# Patient Record
Sex: Male | Born: 1970 | Race: White | Hispanic: No | Marital: Married | Smoking: Current every day smoker
Health system: Southern US, Community
[De-identification: ages and names within clinical notes are randomized; demographics above are authoritative.]

## PROBLEM LIST (undated history)

## (undated) DIAGNOSIS — K759 Inflammatory liver disease, unspecified: Secondary | ICD-10-CM

## (undated) DIAGNOSIS — B1920 Unspecified viral hepatitis C without hepatic coma: Secondary | ICD-10-CM

## (undated) HISTORY — PX: CHOLECYSTECTOMY: SHX55

## (undated) HISTORY — PX: KNEE SURGERY: SHX244

## (undated) HISTORY — PX: NOSE SURGERY: SHX723

---

## 1998-03-02 ENCOUNTER — Emergency Department (HOSPITAL_COMMUNITY): Admission: EM | Admit: 1998-03-02 | Discharge: 1998-03-02 | Payer: Self-pay | Admitting: Emergency Medicine

## 1998-10-09 ENCOUNTER — Emergency Department (HOSPITAL_COMMUNITY): Admission: EM | Admit: 1998-10-09 | Discharge: 1998-10-09 | Payer: Self-pay | Admitting: Emergency Medicine

## 1998-10-09 ENCOUNTER — Encounter: Payer: Self-pay | Admitting: Emergency Medicine

## 1999-11-28 ENCOUNTER — Encounter: Payer: Self-pay | Admitting: Emergency Medicine

## 1999-11-28 ENCOUNTER — Emergency Department (HOSPITAL_COMMUNITY): Admission: EM | Admit: 1999-11-28 | Discharge: 1999-11-28 | Payer: Self-pay | Admitting: Emergency Medicine

## 1999-11-30 ENCOUNTER — Emergency Department (HOSPITAL_COMMUNITY): Admission: EM | Admit: 1999-11-30 | Discharge: 1999-11-30 | Payer: Self-pay | Admitting: Emergency Medicine

## 1999-11-30 ENCOUNTER — Encounter: Payer: Self-pay | Admitting: Emergency Medicine

## 2000-03-01 ENCOUNTER — Emergency Department (HOSPITAL_COMMUNITY): Admission: EM | Admit: 2000-03-01 | Discharge: 2000-03-01 | Payer: Self-pay | Admitting: Emergency Medicine

## 2000-03-01 ENCOUNTER — Encounter: Payer: Self-pay | Admitting: Emergency Medicine

## 2000-04-19 ENCOUNTER — Emergency Department (HOSPITAL_COMMUNITY): Admission: EM | Admit: 2000-04-19 | Discharge: 2000-04-19 | Payer: Self-pay | Admitting: *Deleted

## 2000-04-19 ENCOUNTER — Encounter: Payer: Self-pay | Admitting: Emergency Medicine

## 2000-11-24 ENCOUNTER — Emergency Department (HOSPITAL_COMMUNITY): Admission: EM | Admit: 2000-11-24 | Discharge: 2000-11-24 | Payer: Self-pay | Admitting: *Deleted

## 2001-03-28 ENCOUNTER — Emergency Department (HOSPITAL_COMMUNITY): Admission: EM | Admit: 2001-03-28 | Discharge: 2001-03-28 | Payer: Self-pay | Admitting: *Deleted

## 2003-05-02 ENCOUNTER — Ambulatory Visit (HOSPITAL_COMMUNITY): Admission: RE | Admit: 2003-05-02 | Discharge: 2003-05-02 | Payer: Self-pay | Admitting: Family Medicine

## 2003-05-02 ENCOUNTER — Encounter: Payer: Self-pay | Admitting: Family Medicine

## 2003-05-31 ENCOUNTER — Ambulatory Visit (HOSPITAL_COMMUNITY): Admission: RE | Admit: 2003-05-31 | Discharge: 2003-05-31 | Payer: Self-pay | Admitting: *Deleted

## 2003-05-31 ENCOUNTER — Encounter (INDEPENDENT_AMBULATORY_CARE_PROVIDER_SITE_OTHER): Payer: Self-pay | Admitting: Specialist

## 2004-12-01 ENCOUNTER — Ambulatory Visit: Payer: Self-pay | Admitting: Family Medicine

## 2009-11-18 ENCOUNTER — Emergency Department (HOSPITAL_COMMUNITY): Admission: EM | Admit: 2009-11-18 | Discharge: 2009-11-18 | Payer: Self-pay | Admitting: Emergency Medicine

## 2018-05-10 ENCOUNTER — Encounter (HOSPITAL_COMMUNITY): Payer: Self-pay | Admitting: Emergency Medicine

## 2018-05-10 ENCOUNTER — Other Ambulatory Visit: Payer: Self-pay

## 2018-05-10 ENCOUNTER — Emergency Department (HOSPITAL_COMMUNITY): Payer: Self-pay

## 2018-05-10 ENCOUNTER — Emergency Department (HOSPITAL_COMMUNITY)
Admission: EM | Admit: 2018-05-10 | Discharge: 2018-05-10 | Disposition: A | Discharge status: Home / Self Care | Payer: Self-pay | Attending: Emergency Medicine | Admitting: Emergency Medicine

## 2018-05-10 DIAGNOSIS — R222 Localized swelling, mass and lump, trunk: Secondary | ICD-10-CM | POA: Insufficient documentation

## 2018-05-10 DIAGNOSIS — R229 Localized swelling, mass and lump, unspecified: Secondary | ICD-10-CM

## 2018-05-10 DIAGNOSIS — IMO0002 Reserved for concepts with insufficient information to code with codable children: Secondary | ICD-10-CM

## 2018-05-10 HISTORY — DX: Unspecified viral hepatitis C without hepatic coma: B19.20

## 2018-05-10 NOTE — ED Provider Notes (Signed)
[facility] EMERGENCY DEPARTMENT Provider Note   CSN: [id number] Arrival date & time: 05/10/18  1404     History   Chief Complaint Chief Complaint  Patient presents with  . Mass    HPI Justin Lam is a 47 y.o. male.  HPI   47 year old male presents today with rectal mass. Patient notes probably 20 years ago he developed a small nodule on the left buttock. Patient notes that this has slowly progressed in size. He notes that there is no pain associated with this, no bleeding or surrounding redness, he reports normal bowel movements, denies any systemic symptoms. No other similar findings.  Past Medical History:  Diagnosis Date  . Hepatitis C     There are no active problems to display for this patient.   History reviewed. No pertinent surgical history.      Home Medications    Prior to Admission medications   Not on File    Family History No family history on file.  Social History Social History   Tobacco Use  . Smoking status: Not on file  Substance Use Topics  . Alcohol use: Not on file  . Drug use: Not on file     Allergies   Patient has no known allergies.   Review of Systems Review of Systems  All other systems reviewed and are negative.   Physical Exam Updated Vital Signs BP 140/85 (BP Location: Right Arm)   Pulse 94   Temp 98.7 F (37.1 C) (Oral)   Resp 16   SpO2 100%   Physical Exam  Constitutional: He is oriented to person, place, and time. He appears well-developed and well-nourished.  HENT:  Head: Normocephalic and atraumatic.  Eyes: Pupils are equal, round, and reactive to light. Conjunctivae are normal. Right eye exhibits no discharge. Left eye exhibits no discharge. No scleral icterus.  Neck: Normal range of motion. No JVD present. No tracheal deviation present.  Pulmonary/Chest: Effort normal. No stridor.  Neurological: He is alert and oriented to person, place, and time. Coordination normal.    Skin:  Mass is soft and compressible, non tender  Psychiatric: He has a normal mood and affect. His behavior is normal. Judgment and thought content normal.  Nursing note and vitals reviewed.     ED Treatments / Results  Labs (all labs ordered are listed, but only abnormal results are displayed) Labs Reviewed - No data to display  EKG None  Radiology Dg Sacrum/coccyx  Result Date: 05/10/2018 CLINICAL DATA:  Soft tissue mass EXAM: SACRUM AND COCCYX - 2+ VIEW COMPARISON:  None. FINDINGS: Sacrum is intact as is the pelvic ring. No acute fracture is noted. Postsurgical changes are seen in the pelvis. A soft tissue growth is noted consistent with the patient's given clinical history. No calcifications are noted within. No bony abnormality 2 associated with the soft tissue lesion is noted. IMPRESSION: Soft tissue mass lesion consistent with the given clinical history. No bony abnormality is noted. No calcifications are noted within. Electronically Signed   By: Alcide Clever M.D.   On: 05/10/2018 15:09    Procedures Procedures (including critical care time)  Medications Ordered in ED Medications - No data to display   Initial Impression / Assessment and Plan / ED Course  I have reviewed the triage vital signs and the nursing notes.  Pertinent labs & imaging results that were available during my care of the patient were reviewed by me and considered in my medical decision making (  see chart for details).     Labs:   Imaging: DG sacrum coccyx  Consults:  Therapeutics:  Discharge Meds:   Assessment/Plan: 47 year old male presents today with rectal mass. This has been slowly progressing over the last 20 years, no other involvement. There is vascularity noted throughout the mass, versus soft and compressible, nontender. There is no rectal involvement. Patient will need outpatient follow-up with general surgery for consultation on removal at his request. Patient verbalized that he  would follow-up as an outpatient, strict return precautions given. Verbalized understanding and agreement today's plan.   Final Clinical Impressions(s) / ED Diagnoses   Final diagnoses:  Mass    ED Discharge Orders    None       Rosalio LoudHedges, Keshonda Monsour, PA-C 05/10/18 1557    Melene PlanFloyd, Dan, DO 05/10/18 1719

## 2018-05-10 NOTE — ED Provider Notes (Signed)
Patient placed in Quick Look pathway, seen and evaluated   Chief Complaint: mass on bottom  HPI:   Pt has a swollen area on his bottom. Pt has had for over 20 years   ROS: no fever, no chills   Physical Exam:   Gen: No distress  Neuro: Awake and Alert  Skin: Warm    Focused Exam: large swollen area buttock    Initiation of care has begun. The patient has been counseled on the process, plan, and necessity for staying for the completion/evaluation, and the remainder of the medical screening examination   Osie CheeksSofia, Sokhna Christoph K, PA-C 05/10/18 1448    Loren RacerYelverton, David, MD 05/12/18 [phone]

## 2018-05-10 NOTE — ED Notes (Signed)
Patient able to ambulate independently  

## 2018-05-10 NOTE — Discharge Instructions (Addendum)
Please read attached information. If you experience any new or worsening signs or symptoms please return to the emergency room for evaluation. Please follow-up with your primary care provider or specialist as discussed.  °

## 2018-05-10 NOTE — ED Triage Notes (Signed)
Patient complains of large mass, approximately 4 cm across, projecting from left buttock, states he had a growth there since he was 28. Patient alert, oriented, and in no apparent distress at this time.

## 2018-05-19 ENCOUNTER — Emergency Department (HOSPITAL_COMMUNITY)
Admission: EM | Admit: 2018-05-19 | Discharge: 2018-05-19 | Disposition: A | Discharge status: Home / Self Care | Payer: Self-pay | Attending: Emergency Medicine | Admitting: Emergency Medicine

## 2018-05-19 ENCOUNTER — Encounter (HOSPITAL_COMMUNITY): Payer: Self-pay | Admitting: Emergency Medicine

## 2018-05-19 ENCOUNTER — Other Ambulatory Visit: Payer: Self-pay

## 2018-05-19 DIAGNOSIS — Y999 Unspecified external cause status: Secondary | ICD-10-CM | POA: Insufficient documentation

## 2018-05-19 DIAGNOSIS — Y929 Unspecified place or not applicable: Secondary | ICD-10-CM | POA: Insufficient documentation

## 2018-05-19 DIAGNOSIS — Z23 Encounter for immunization: Secondary | ICD-10-CM | POA: Insufficient documentation

## 2018-05-19 DIAGNOSIS — W228XXA Striking against or struck by other objects, initial encounter: Secondary | ICD-10-CM | POA: Insufficient documentation

## 2018-05-19 DIAGNOSIS — S51811A Laceration without foreign body of right forearm, initial encounter: Secondary | ICD-10-CM | POA: Insufficient documentation

## 2018-05-19 DIAGNOSIS — Y939 Activity, unspecified: Secondary | ICD-10-CM | POA: Insufficient documentation

## 2018-05-19 DIAGNOSIS — S41111A Laceration without foreign body of right upper arm, initial encounter: Secondary | ICD-10-CM

## 2018-05-19 MED ORDER — LIDOCAINE-EPINEPHRINE 1 %-1:100000 IJ SOLN
20.0000 mL | Freq: Once | INTRAMUSCULAR | Status: DC
  Filled 2018-05-19: qty 20

## 2018-05-19 MED ORDER — LIDOCAINE HCL (PF) 1 % IJ SOLN
30.0000 mL | Freq: Once | INTRAMUSCULAR | Status: AC
  Administered 2018-05-19: 30 mL

## 2018-05-19 MED ORDER — TETANUS-DIPHTH-ACELL PERTUSSIS 5-2.5-18.5 LF-MCG/0.5 IM SUSP
0.5000 mL | Freq: Once | INTRAMUSCULAR | Status: AC
  Administered 2018-05-19: 0.5 mL via INTRAMUSCULAR
  Filled 2018-05-19: qty 0.5

## 2018-05-19 MED ORDER — LIDOCAINE HCL (PF) 1 % IJ SOLN
INTRAMUSCULAR | Status: AC
  Administered 2018-05-19: 30 mL
  Filled 2018-05-19: qty 30

## 2018-05-19 NOTE — ED Triage Notes (Signed)
Pt has approximately 1 inch laceration on right forearm, bleeding controlled. Pt states cut arm on a piece of steel.

## 2018-05-19 NOTE — Discharge Instructions (Addendum)
Please read attached information. If you experience any new or worsening signs or symptoms please return to the emergency room for evaluation. Please follow-up with your primary care provider or specialist as discussed.  °

## 2018-05-19 NOTE — ED Provider Notes (Signed)
[facility] Provider Note   CSN: [id number] Arrival date & time: 05/19/18  1309    History   Chief Complaint Chief Complaint  Patient presents with  . Laceration    HPI Justin Lam is a 47 y.o. male.  HPI   [age] YOF presents today with complaints of laceration. Patient notes a hitch from a trailer fell striking his right arm causing a wound to the arm. He denies any loss of strength range of motion of the wrist hand or elbow. No significant swelling. Uncertain of his last tetanus vaccination. No other complaints here today.    Past Medical History:  Diagnosis Date  . Hepatitis C     There are no active problems to display for this patient.   No past surgical history on file.      Home Medications    Prior to Admission medications   Not on File    Family History No family history on file.  Social History Social History   Tobacco Use  . Smoking status: Not on file  Substance Use Topics  . Alcohol use: Not on file  . Drug use: Not on file     Allergies   Patient has no known allergies.   Review of Systems Review of Systems  All other systems reviewed and are negative.    Physical Exam Updated Vital Signs BP 120/82 (BP Location: Right Arm)   Pulse 95   Temp 98.2 F (36.8 C) (Oral)   Resp 16   SpO2 99%   Physical Exam  Constitutional: He is oriented to person, place, and time. He appears well-developed and well-nourished.  HENT:  Head: Normocephalic and atraumatic.  Eyes: Pupils are equal, round, and reactive to light. Conjunctivae are normal. Right eye exhibits no discharge. Left eye exhibits no discharge. No scleral icterus.  Neck: Normal range of motion. No JVD present. No tracheal deviation present.  Pulmonary/Chest: Effort normal. No stridor.  Musculoskeletal:  2 cm laceration to the right forearm, no deep system involvement full active range of motion wrist hand and fingers  Neurological:  He is alert and oriented to person, place, and time. Coordination normal.  Psychiatric: He has a normal mood and affect. His behavior is normal. Judgment and thought content normal.  Nursing note and vitals reviewed.    ED Treatments / Results  Labs (all labs ordered are listed, but only abnormal results are displayed) Labs Reviewed - No data to display  EKG None  Radiology No results found.  Procedures .Marland Kitchen.Laceration Repair Date/Time: 05/19/2018 1:40 PM Performed by: Eyvonne MechanicHedges, Frankie Scipio, PA-C Authorized by: Eyvonne MechanicHedges, Anie Juniel, PA-C   Consent:    Consent given by:  Patient   Risks discussed:  Infection, need for additional repair, poor cosmetic result, poor wound healing, nerve damage, pain, retained foreign body, tendon damage and vascular damage   Alternatives discussed:  No treatment and delayed treatment Anesthesia (see MAR for exact dosages):    Anesthesia method:  Local infiltration   Local anesthetic:  Lidocaine 1% w/o epi Laceration details:    Location: Right forearm.   Length (cm):  2 Repair type:    Repair type:  Simple Pre-procedure details:    Preparation:  Patient was prepped and draped in usual sterile fashion Exploration:    Wound exploration: wound explored through full range of motion and entire depth of wound probed and visualized     Wound extent: no areolar tissue violation noted, no fascia violation noted, no  foreign bodies/material noted, no muscle damage noted, no nerve damage noted, no tendon damage noted, no underlying fracture noted and no vascular damage noted     Contaminated: no   Treatment:    Area cleansed with:  Saline   Amount of cleaning:  Standard   Irrigation solution:  Sterile saline   Visualized foreign bodies/material removed: no   Skin repair:    Repair method:  Sutures   Suture size:  3-0   Suture material:  Fast-absorbing gut   Number of sutures:  3 Approximation:    Approximation:  Close Post-procedure details:    Dressing:   Antibiotic ointment   Patient tolerance of procedure:  Tolerated well, no immediate complications   (including critical care time)  Medications Ordered in ED Medications  lidocaine-EPINEPHrine (XYLOCAINE W/EPI) 1 %-1:100000 (with pres) injection 20 mL (has no administration in time range)  Tdap (BOOSTRIX) injection 0.5 mL (has no administration in time range)  lidocaine (PF) (XYLOCAINE) 1 % injection (has no administration in time range)     Initial Impression / Assessment and Plan / ED Course  I have reviewed the triage vital signs and the nursing notes.  Pertinent labs & imaging results that were available during my care of the patient were reviewed by me and considered in my medical decision making (see chart for details).     Simple laceration repaired here without complication. No deep space involvement. No other injuries return partial skin. He verbalized understanding and agreement to today's plan.  Final Clinical Impressions(s) / ED Diagnoses   Final diagnoses:  Laceration of right upper extremity, initial encounter    ED Discharge Orders    None       Rosalio Loud 05/19/18 1341    Samuel Jester, DO 05/22/18 1523

## 2019-07-20 ENCOUNTER — Emergency Department (HOSPITAL_COMMUNITY): Payer: Self-pay | Admitting: Anesthesiology

## 2019-07-20 ENCOUNTER — Ambulatory Visit (HOSPITAL_COMMUNITY)
Admission: EM | Admit: 2019-07-20 | Discharge: 2019-07-21 | Disposition: A | Discharge status: Home / Self Care | Payer: Self-pay | Attending: Orthopedic Surgery | Admitting: Orthopedic Surgery

## 2019-07-20 ENCOUNTER — Emergency Department (HOSPITAL_COMMUNITY): Payer: Self-pay

## 2019-07-20 ENCOUNTER — Other Ambulatory Visit: Payer: Self-pay

## 2019-07-20 ENCOUNTER — Encounter (HOSPITAL_COMMUNITY)
Admission: EM | Disposition: A | Discharge status: Home / Self Care | Payer: Self-pay | Source: Home / Self Care | Attending: Emergency Medicine

## 2019-07-20 ENCOUNTER — Encounter (HOSPITAL_COMMUNITY): Payer: Self-pay

## 2019-07-20 DIAGNOSIS — S81041A Puncture wound with foreign body, right knee, initial encounter: Secondary | ICD-10-CM | POA: Insufficient documentation

## 2019-07-20 DIAGNOSIS — S80252A Superficial foreign body, left knee, initial encounter: Secondary | ICD-10-CM | POA: Diagnosis present

## 2019-07-20 DIAGNOSIS — W294XXA Contact with nail gun, initial encounter: Secondary | ICD-10-CM | POA: Insufficient documentation

## 2019-07-20 DIAGNOSIS — Z419 Encounter for procedure for purposes other than remedying health state, unspecified: Secondary | ICD-10-CM

## 2019-07-20 DIAGNOSIS — B1920 Unspecified viral hepatitis C without hepatic coma: Secondary | ICD-10-CM | POA: Insufficient documentation

## 2019-07-20 DIAGNOSIS — Z20828 Contact with and (suspected) exposure to other viral communicable diseases: Secondary | ICD-10-CM | POA: Insufficient documentation

## 2019-07-20 DIAGNOSIS — Y990 Civilian activity done for income or pay: Secondary | ICD-10-CM | POA: Insufficient documentation

## 2019-07-20 HISTORY — PX: FOREIGN BODY REMOVAL: SHX962

## 2019-07-20 HISTORY — PX: KNEE ARTHROSCOPY: SHX127

## 2019-07-20 LAB — SARS CORONAVIRUS 2 BY RT PCR (HOSPITAL ORDER, PERFORMED IN ~~LOC~~ HOSPITAL LAB): SARS Coronavirus 2: NEGATIVE

## 2019-07-20 SURGERY — ARTHROSCOPY, KNEE
Anesthesia: General | Site: Knee | Laterality: Left

## 2019-07-20 MED ORDER — OXYCODONE HCL 5 MG PO TABS
5.0000 mg | ORAL_TABLET | Freq: Once | ORAL | Status: AC | PRN
  Administered 2019-07-20: 5 mg via ORAL

## 2019-07-20 MED ORDER — ONDANSETRON HCL 4 MG/2ML IJ SOLN: 4.0000 mg | Freq: Once | INTRAMUSCULAR | Status: DC | PRN

## 2019-07-20 MED ORDER — METHOCARBAMOL 1000 MG/10ML IJ SOLN
500.0000 mg | Freq: Three times a day (TID) | INTRAVENOUS | Status: DC
  Filled 2019-07-20 (×4): qty 5

## 2019-07-20 MED ORDER — ROCURONIUM BROMIDE 10 MG/ML (PF) SYRINGE
PREFILLED_SYRINGE | INTRAVENOUS | Status: AC
  Filled 2019-07-20: qty 20

## 2019-07-20 MED ORDER — LACTATED RINGERS IV SOLN
INTRAVENOUS | Status: DC
  Administered 2019-07-20 (×2): via INTRAVENOUS

## 2019-07-20 MED ORDER — FENTANYL CITRATE (PF) 250 MCG/5ML IJ SOLN
INTRAMUSCULAR | Status: AC
  Filled 2019-07-20: qty 5

## 2019-07-20 MED ORDER — METHYLENE BLUE 0.5 % INJ SOLN
2.0000 mL | INTRAVENOUS | Status: AC
  Administered 2019-07-20: 10 mg via INTRAVENOUS
  Filled 2019-07-20: qty 10

## 2019-07-20 MED ORDER — OXYCODONE-ACETAMINOPHEN 5-325 MG PO TABS
1.0000 | ORAL_TABLET | Freq: Four times a day (QID) | ORAL | Status: DC | PRN
  Administered 2019-07-20: 1 via ORAL
  Filled 2019-07-20: qty 1

## 2019-07-20 MED ORDER — DOCUSATE SODIUM 100 MG PO CAPS
100.0000 mg | ORAL_CAPSULE | Freq: Two times a day (BID) | ORAL | Status: DC
  Administered 2019-07-20 – 2019-07-21 (×2): 100 mg via ORAL
  Filled 2019-07-20 (×2): qty 1

## 2019-07-20 MED ORDER — ETOMIDATE 2 MG/ML IV SOLN
0.1000 mg/kg | Freq: Once | INTRAVENOUS | Status: AC
  Administered 2019-07-20: 16 mg via INTRAVENOUS
  Filled 2019-07-20: qty 10

## 2019-07-20 MED ORDER — DEXAMETHASONE SODIUM PHOSPHATE 10 MG/ML IJ SOLN
INTRAMUSCULAR | Status: DC | PRN
  Administered 2019-07-20: 10 mg via INTRAVENOUS

## 2019-07-20 MED ORDER — SODIUM CHLORIDE 0.9 % IR SOLN
Status: DC | PRN
  Administered 2019-07-20: 1000 mL

## 2019-07-20 MED ORDER — ACETAMINOPHEN 160 MG/5ML PO SOLN: 325.0000 mg | ORAL | Status: DC | PRN

## 2019-07-20 MED ORDER — ACETAMINOPHEN 500 MG PO TABS
500.0000 mg | ORAL_TABLET | Freq: Two times a day (BID) | ORAL | Status: DC
  Administered 2019-07-20 – 2019-07-21 (×2): 500 mg via ORAL
  Filled 2019-07-20 (×2): qty 1

## 2019-07-20 MED ORDER — PROPOFOL 10 MG/ML IV BOLUS
INTRAVENOUS | Status: DC | PRN
  Administered 2019-07-20: 150 mg via INTRAVENOUS

## 2019-07-20 MED ORDER — METHOCARBAMOL 500 MG PO TABS
750.0000 mg | ORAL_TABLET | Freq: Three times a day (TID) | ORAL | Status: DC
  Administered 2019-07-20 – 2019-07-21 (×3): 750 mg via ORAL
  Filled 2019-07-20 (×3): qty 2

## 2019-07-20 MED ORDER — SODIUM CHLORIDE 0.9 % IV SOLN
INTRAVENOUS | Status: AC | PRN
  Administered 2019-07-20: 10 mL/h via INTRAVENOUS

## 2019-07-20 MED ORDER — CEFAZOLIN SODIUM-DEXTROSE 1-4 GM/50ML-% IV SOLN
1.0000 g | Freq: Once | INTRAVENOUS | Status: AC
  Administered 2019-07-20: 1 g via INTRAVENOUS
  Filled 2019-07-20: qty 50

## 2019-07-20 MED ORDER — MIDAZOLAM HCL 5 MG/5ML IJ SOLN
INTRAMUSCULAR | Status: DC | PRN
  Administered 2019-07-20: 2 mg via INTRAVENOUS

## 2019-07-20 MED ORDER — KETOROLAC TROMETHAMINE 15 MG/ML IJ SOLN
15.0000 mg | Freq: Four times a day (QID) | INTRAMUSCULAR | Status: DC
  Administered 2019-07-20 – 2019-07-21 (×3): 15 mg via INTRAVENOUS
  Filled 2019-07-20 (×3): qty 1

## 2019-07-20 MED ORDER — ONDANSETRON HCL 4 MG PO TABS: 4.0000 mg | ORAL_TABLET | Freq: Four times a day (QID) | ORAL | Status: DC | PRN

## 2019-07-20 MED ORDER — CEFAZOLIN SODIUM-DEXTROSE 2-4 GM/100ML-% IV SOLN
INTRAVENOUS | Status: AC
  Filled 2019-07-20: qty 100

## 2019-07-20 MED ORDER — ETOMIDATE 2 MG/ML IV SOLN
INTRAVENOUS | Status: AC | PRN
  Administered 2019-07-20: 6 mg via INTRAVENOUS
  Administered 2019-07-20: 4 mg via INTRAVENOUS
  Administered 2019-07-20: 6 mg via INTRAVENOUS

## 2019-07-20 MED ORDER — MIDAZOLAM HCL 2 MG/2ML IJ SOLN
INTRAMUSCULAR | Status: AC
  Filled 2019-07-20: qty 2

## 2019-07-20 MED ORDER — CEFAZOLIN SODIUM-DEXTROSE 1-4 GM/50ML-% IV SOLN
1.0000 g | Freq: Four times a day (QID) | INTRAVENOUS | Status: AC
  Administered 2019-07-20 – 2019-07-21 (×3): 1 g via INTRAVENOUS
  Filled 2019-07-20 (×3): qty 50

## 2019-07-20 MED ORDER — CEFAZOLIN SODIUM-DEXTROSE 2-3 GM-%(50ML) IV SOLR
INTRAVENOUS | Status: DC | PRN
  Administered 2019-07-20: 2 g via INTRAVENOUS

## 2019-07-20 MED ORDER — METOCLOPRAMIDE HCL 5 MG/ML IJ SOLN: 5.0000 mg | Freq: Three times a day (TID) | INTRAMUSCULAR | Status: DC | PRN

## 2019-07-20 MED ORDER — FENTANYL CITRATE (PF) 100 MCG/2ML IJ SOLN
25.0000 ug | INTRAMUSCULAR | Status: DC | PRN
  Administered 2019-07-20 (×3): 50 ug via INTRAVENOUS

## 2019-07-20 MED ORDER — CEFAZOLIN SODIUM-DEXTROSE 2-4 GM/100ML-% IV SOLN: 2.0000 g | INTRAVENOUS | Status: DC

## 2019-07-20 MED ORDER — HYDROMORPHONE HCL 1 MG/ML IJ SOLN
1.0000 mg | Freq: Once | INTRAMUSCULAR | Status: AC
  Administered 2019-07-20: 1 mg via INTRAVENOUS
  Filled 2019-07-20: qty 1

## 2019-07-20 MED ORDER — EPHEDRINE SULFATE-NACL 50-0.9 MG/10ML-% IV SOSY
PREFILLED_SYRINGE | INTRAVENOUS | Status: DC | PRN
  Administered 2019-07-20 (×2): 5 mg via INTRAVENOUS

## 2019-07-20 MED ORDER — EPHEDRINE 5 MG/ML INJ
INTRAVENOUS | Status: AC
  Filled 2019-07-20: qty 10

## 2019-07-20 MED ORDER — DEXAMETHASONE SODIUM PHOSPHATE 10 MG/ML IJ SOLN
INTRAMUSCULAR | Status: AC
  Filled 2019-07-20: qty 2

## 2019-07-20 MED ORDER — CHLORHEXIDINE GLUCONATE 4 % EX LIQD
60.0000 mL | Freq: Once | CUTANEOUS | Status: DC
  Filled 2019-07-20: qty 60

## 2019-07-20 MED ORDER — FENTANYL CITRATE (PF) 100 MCG/2ML IJ SOLN
INTRAMUSCULAR | Status: AC
  Filled 2019-07-20: qty 2

## 2019-07-20 MED ORDER — ONDANSETRON HCL 4 MG/2ML IJ SOLN: 4.0000 mg | Freq: Four times a day (QID) | INTRAMUSCULAR | Status: DC | PRN

## 2019-07-20 MED ORDER — ACETAMINOPHEN 325 MG PO TABS: 325.0000 mg | ORAL_TABLET | ORAL | Status: DC | PRN

## 2019-07-20 MED ORDER — OXYCODONE HCL 5 MG/5ML PO SOLN: 5.0000 mg | Freq: Once | ORAL | Status: AC | PRN

## 2019-07-20 MED ORDER — FENTANYL CITRATE (PF) 100 MCG/2ML IJ SOLN
INTRAMUSCULAR | Status: DC | PRN
  Administered 2019-07-20: 50 ug via INTRAVENOUS
  Administered 2019-07-20: 100 ug via INTRAVENOUS

## 2019-07-20 MED ORDER — HYDROMORPHONE HCL 1 MG/ML IJ SOLN: 0.5000 mg | INTRAMUSCULAR | Status: DC | PRN

## 2019-07-20 MED ORDER — POTASSIUM CHLORIDE IN NACL 20-0.9 MEQ/L-% IV SOLN
INTRAVENOUS | Status: DC
  Administered 2019-07-20: 23:00:00 via INTRAVENOUS
  Filled 2019-07-20: qty 1000

## 2019-07-20 MED ORDER — METOCLOPRAMIDE HCL 5 MG PO TABS: 5.0000 mg | ORAL_TABLET | Freq: Three times a day (TID) | ORAL | Status: DC | PRN

## 2019-07-20 MED ORDER — LIDOCAINE 2% (20 MG/ML) 5 ML SYRINGE
INTRAMUSCULAR | Status: DC | PRN
  Administered 2019-07-20: 100 mg via INTRAVENOUS

## 2019-07-20 MED ORDER — MEPERIDINE HCL 25 MG/ML IJ SOLN: 6.2500 mg | INTRAMUSCULAR | Status: DC | PRN

## 2019-07-20 MED ORDER — SUCCINYLCHOLINE CHLORIDE 200 MG/10ML IV SOSY
PREFILLED_SYRINGE | INTRAVENOUS | Status: DC | PRN
  Administered 2019-07-20: 140 mg via INTRAVENOUS

## 2019-07-20 MED ORDER — POVIDONE-IODINE 10 % EX SWAB: 2.0000 "application " | Freq: Once | CUTANEOUS | Status: DC

## 2019-07-20 MED ORDER — OXYCODONE HCL 5 MG PO TABS
ORAL_TABLET | ORAL | Status: AC
  Filled 2019-07-20: qty 1

## 2019-07-20 MED ORDER — LIDOCAINE-EPINEPHRINE 1 %-1:100000 IJ SOLN
10.0000 mL | INTRAMUSCULAR | Status: AC
  Administered 2019-07-20: 1 mL
  Filled 2019-07-20: qty 10

## 2019-07-20 MED ORDER — ONDANSETRON HCL 4 MG/2ML IJ SOLN
INTRAMUSCULAR | Status: DC | PRN
  Administered 2019-07-20: 4 mg via INTRAVENOUS

## 2019-07-20 SURGICAL SUPPLY — 55 items
BANDAGE ESMARK 6X9 LF (GAUZE/BANDAGES/DRESSINGS) IMPLANT
BLADE CUDA 5.5 (BLADE) IMPLANT
BLADE EXCALIBUR 4.0MM X 13CM (MISCELLANEOUS) ×1
BLADE EXCALIBUR 4.0X13 (MISCELLANEOUS) ×3 IMPLANT
BLADE SURG 11 STRL SS (BLADE) ×4 IMPLANT
BNDG CMPR 9X6 STRL LF SNTH (GAUZE/BANDAGES/DRESSINGS)
BNDG ELASTIC 4X5.8 VLCR STR LF (GAUZE/BANDAGES/DRESSINGS) ×2 IMPLANT
BNDG ELASTIC 6X5.8 VLCR STR LF (GAUZE/BANDAGES/DRESSINGS) ×6 IMPLANT
BNDG ESMARK 6X9 LF (GAUZE/BANDAGES/DRESSINGS)
BNDG GAUZE ELAST 4 BULKY (GAUZE/BANDAGES/DRESSINGS) ×2 IMPLANT
BRUSH SCRUB EZ PLAIN DRY (MISCELLANEOUS) ×8 IMPLANT
COVER SURGICAL LIGHT HANDLE (MISCELLANEOUS) ×6 IMPLANT
COVER WAND RF STERILE (DRAPES) ×4 IMPLANT
CUFF TOURN SGL QUICK 34 (TOURNIQUET CUFF)
CUFF TOURN SGL QUICK 42 (TOURNIQUET CUFF) IMPLANT
CUFF TRNQT CYL 34X4.125X (TOURNIQUET CUFF) IMPLANT
DRAPE ARTHROSCOPY W/POUCH 114 (DRAPES) ×4 IMPLANT
DRAPE C-ARM 42X72 X-RAY (DRAPES) ×2 IMPLANT
DRAPE OEC MINIVIEW 54X84 (DRAPES) IMPLANT
DRAPE U-SHAPE 47X51 STRL (DRAPES) ×4 IMPLANT
DRSG ADAPTIC 3X8 NADH LF (GAUZE/BANDAGES/DRESSINGS) ×2 IMPLANT
DRSG EMULSION OIL 3X3 NADH (GAUZE/BANDAGES/DRESSINGS) ×4 IMPLANT
DRSG PAD ABDOMINAL 8X10 ST (GAUZE/BANDAGES/DRESSINGS) ×6 IMPLANT
GAUZE 4X4 16PLY RFD (DISPOSABLE) ×4 IMPLANT
GAUZE SPONGE 4X4 12PLY STRL (GAUZE/BANDAGES/DRESSINGS) ×4 IMPLANT
GLOVE BIO SURGEON STRL SZ7.5 (GLOVE) ×4 IMPLANT
GLOVE BIO SURGEON STRL SZ8 (GLOVE) ×4 IMPLANT
GLOVE BIOGEL PI IND STRL 7.5 (GLOVE) ×2 IMPLANT
GLOVE BIOGEL PI IND STRL 8 (GLOVE) ×2 IMPLANT
GLOVE BIOGEL PI INDICATOR 7.5 (GLOVE) ×2
GLOVE BIOGEL PI INDICATOR 8 (GLOVE) ×2
GOWN STRL REUS W/ TWL LRG LVL3 (GOWN DISPOSABLE) ×4 IMPLANT
GOWN STRL REUS W/ TWL XL LVL3 (GOWN DISPOSABLE) ×2 IMPLANT
GOWN STRL REUS W/TWL LRG LVL3 (GOWN DISPOSABLE) ×8
GOWN STRL REUS W/TWL XL LVL3 (GOWN DISPOSABLE) ×4
KIT BASIN OR (CUSTOM PROCEDURE TRAY) ×4 IMPLANT
KIT TURNOVER KIT B (KITS) ×4 IMPLANT
MANIFOLD NEPTUNE II (INSTRUMENTS) ×4 IMPLANT
PACK ARTHROSCOPY DSU (CUSTOM PROCEDURE TRAY) ×4 IMPLANT
PAD ARMBOARD 7.5X6 YLW CONV (MISCELLANEOUS) ×6 IMPLANT
PAD CAST 3X4 CTTN HI CHSV (CAST SUPPLIES) IMPLANT
PAD CAST 4YDX4 CTTN HI CHSV (CAST SUPPLIES) IMPLANT
PADDING CAST COTTON 3X4 STRL (CAST SUPPLIES) ×4
PADDING CAST COTTON 4X4 STRL (CAST SUPPLIES) ×4
PADDING CAST COTTON 6X4 STRL (CAST SUPPLIES) ×6 IMPLANT
SUT ETHILON 2 0 FS 18 (SUTURE) ×2 IMPLANT
SUT ETHILON 4 0 PS 2 18 (SUTURE) ×4 IMPLANT
SUT PDS AB 2-0 CT1 27 (SUTURE) ×2 IMPLANT
SYR BULB IRRIGATION 50ML (SYRINGE) ×2 IMPLANT
TOWEL GREEN STERILE FF (TOWEL DISPOSABLE) ×6 IMPLANT
TUBE CONNECTING 12'X1/4 (SUCTIONS) ×1
TUBE CONNECTING 12X1/4 (SUCTIONS) ×3 IMPLANT
TUBING ARTHROSCOPY IRRIG 16FT (MISCELLANEOUS) ×4 IMPLANT
WAND STAR VAC 90 (SURGICAL WAND) IMPLANT
WATER STERILE IRR 1000ML POUR (IV SOLUTION) ×4 IMPLANT

## 2019-07-20 NOTE — ED Triage Notes (Signed)
C/o nail in knee from nail gun

## 2019-07-20 NOTE — Transfer of Care (Signed)
Immediate Anesthesia Transfer of Care Note  Patient: Justin Lam  Procedure(s) Performed: ARTHROSCOPY Irrigation and Debridment of Left knee (Left Knee) Removal Foreign Body Left Lower Extremity  Patient Location: PACU  Anesthesia Type:General  Level of Consciousness: awake, alert , oriented, drowsy and patient cooperative  Airway & Oxygen Therapy: Patient Spontanous Breathing and Patient connected to nasal cannula oxygen  Post-op Assessment: Report given to RN, Post -op Vital signs reviewed and stable and Patient moving all extremities X 4  Post vital signs: Reviewed and stable  Last Vitals:  Vitals Value Taken Time  BP 145/52 07/20/19 1931  Temp    Pulse 100 07/20/19 1932  Resp 18 07/20/19 1932  SpO2 97 % 07/20/19 1932  Vitals shown include unvalidated device data.  Last Pain:  Vitals:   07/20/19 1405  PainSc: 8          Complications: No apparent anesthesia complications

## 2019-07-20 NOTE — Anesthesia Postprocedure Evaluation (Signed)
Anesthesia Post Note  Patient: Justin Lam  Procedure(s) Performed: ARTHROSCOPY Irrigation and Debridment of Left knee (Left Knee) Removal Foreign Body Left Lower Extremity     Patient location during evaluation: PACU Anesthesia Type: General Level of consciousness: awake and alert Pain management: pain level controlled Vital Signs Assessment: post-procedure vital signs reviewed and stable Respiratory status: spontaneous breathing, nonlabored ventilation, respiratory function stable and patient connected to nasal cannula oxygen Cardiovascular status: blood pressure returned to baseline and stable Postop Assessment: no apparent nausea or vomiting Anesthetic complications: no    Last Vitals:  Vitals:   07/20/19 2030 07/20/19 2045  BP:  (!) 139/94  Pulse: 83 77  Resp: 13 16  Temp:  (!) 36.4 C  SpO2: 92% 96%    Last Pain:  Vitals:   07/20/19 2045  TempSrc: Oral  PainSc: 6                  Ryan P Ellender

## 2019-07-20 NOTE — Anesthesia Preprocedure Evaluation (Addendum)
Anesthesia Evaluation  Patient identified by MRN, date of birth, ID band Patient awake    Reviewed: Allergy & Precautions, H&P , NPO status , Patient's Chart, lab work & pertinent test results, reviewed documented beta blocker date and time   Airway Mallampati: II  TM Distance: >3 FB Neck ROM: full    Dental no notable dental hx.    Pulmonary neg pulmonary ROS,    Pulmonary exam normal breath sounds clear to auscultation       Cardiovascular Exercise Tolerance: Good negative cardio ROS   Rhythm:regular Rate:Normal     Neuro/Psych negative neurological ROS  negative psych ROS   GI/Hepatic negative GI ROS, (+) Hepatitis -, C  Endo/Other  negative endocrine ROS  Renal/GU negative Renal ROS  negative genitourinary   Musculoskeletal   Abdominal   Peds  Hematology negative hematology ROS (+)   Anesthesia Other Findings   Reproductive/Obstetrics negative OB ROS                             Anesthesia Physical Anesthesia Plan  ASA: II and emergent  Anesthesia Plan: General   Post-op Pain Management:    Induction: Intravenous  PONV Risk Score and Plan: 2 and Ondansetron, Dexamethasone and Treatment may vary due to age or medical condition  Airway Management Planned: LMA and Oral ETT  Additional Equipment:   Intra-op Plan:   Post-operative Plan:   Informed Consent: I have reviewed the patients History and Physical, chart, labs and discussed the procedure including the risks, benefits and alternatives for the proposed anesthesia with the patient or authorized representative who has indicated his/her understanding and acceptance.     Dental Advisory Given  Plan Discussed with: CRNA, Anesthesiologist and Surgeon  Anesthesia Plan Comments: ( )        Anesthesia Quick Evaluation

## 2019-07-20 NOTE — Procedures (Signed)
Clinician: Jari Pigg, PA-C  Preprocedure diagnosis: Retained foreign body (roofing nail) left proximal tibia Postprocedure diagnosis: Same  Procedure: Removal of foreign body left proximal tibia, left knee arthrocentesis  Medications: 5 cc lidocaine with epinephrine, local injection around the nail, medial proximal lower leg           3 cc lidocaine with epinephrine, local injection, lateral knee for arthrocentesis              1 cc of methylene blue in 25 cc of sterile saline for any aspiration           See emergency room providers note for medications used for sedation (etomidate)  Details:  After consent was obtained the area around the retained nail was cleansed with alcohol swabs and Betadine swab x3 and local anesthetic was infiltrated into the soft tissue.  This was allowed to provide some local anesthetic effect.  Once this was achieved conscious sedation was performed under the supervision of Dr. Alvino Chapel.  Etomidate was given in incremental doses to achieve the desired effect.  Upon doing so a pair of vice grips was used to obtain firm hold of the screw.  The screw came out easily of the bone however there are several copper wire fragments that are on the nail itself that I suspect are used to like it inferior to other nails to allow to be used in a nail gun this provided much resistance coming out of the soft tissue as it was causing the soft tissue Around it.  A small incision was made around the copper fragment in an attempt to provide more excursion of the soft tissue however the proper time did break off into the soft tissue as the nail was removed completely.  1 piece of copper wire remained on the nail upon removal and another piece remained in the soft tissue.  After this was completed patient did feel significantly better.  Then moving to the lateral knee an area along the superolateral border of the patella was identified palpating for the soft spot which would indicate  intra-articular area this area was again cleansed with alcohol swab with Betadine x3 3 cc of lidocaine was infiltrated into the soft tissue as well.  After which an 18-gauge needle with a mixture of methylene blue was infiltrated into the knee joint.  Total of 25 cc of saline and methylene blue was infiltrated into the joint.  Did appreciate expansion of the joint capsule indicating the mixture was in the knee joint.  Did not appreciate any extravasation of the methylene blue solution from the traumatic wound.  Attempted to re-aspirate the fluid from the knee but was met with significant resistance that did not get any backflow of this.  Needle and syringe were removed.  X-ray was then performed which did demonstrate retained piece of copper wire along the medial soft tissue.  Plan  Patient tolerated nail removal and arthrocentesis well.  Do have a little concern about the inability to recover some of the methylene the solution but it is not out of the ordinary to be unable to re-aspirate an infiltrated solution.  Slight concern as the medial soft tissues appear to be a little more swollen raising possible concern for dilated joint capsule.  Anticipate taking patient to the OR later today for irrigation debridement.  We will reevaluate the patient in a couple of hours to see how he is doing if there is no evidence of any extravasation and his pain  is improved we may proceed with admission and observation in leu of taking him to the OR.   Jari Pigg, PA-C [phone] (C) 07/20/2019, 3:25 PM  [facility] [place] [address] [phone] (F)

## 2019-07-20 NOTE — ED Provider Notes (Signed)
  Physical Exam  BP (!) 143/91   Pulse 68   Temp 98 F (36.7 C)   Resp 20   Ht 5\' 8"  (1.727 m)   Wt 63.5 kg   SpO2 99%   BMI 21.29 kg/m   Physical Exam  ED Course/Procedures     Sedation procedure  Date/Time: 07/20/2019 3:52 PM Performed by: Davonna Belling, MD Authorized by: Davonna Belling, MD   Consent:    Consent obtained:  Written   Consent given by:  Patient   Risks discussed:  Allergic reaction, dysrhythmia, inadequate sedation, nausea, prolonged hypoxia resulting in organ damage, respiratory compromise necessitating ventilatory assistance and intubation and vomiting   Alternatives discussed:  Analgesia without sedation Universal protocol:    Immediately prior to procedure a time out was called: yes     Patient identity confirmation method:  Arm band and verbally with patient Indications:    Procedure performed:  Foreign body removal Pre-sedation assessment:    Time since last food or drink:  4   ASA classification: class 2 - patient with mild systemic disease     Mallampati score:  I - soft palate, uvula, fauces, pillars visible   Pre-sedation assessments completed and reviewed: airway patency, cardiovascular function, hydration status, mental status, pain level, respiratory function and temperature     Pre-sedation assessments completed and reviewed: nausea/vomiting not reviewed   Immediate pre-procedure details:    Reviewed: vital signs, relevant labs/tests and NPO status     Verified: bag valve mask available, emergency equipment available, intubation equipment available, IV patency confirmed and oxygen available   Procedure details (see MAR for exact dosages):    Preoxygenation:  Nasal cannula   Sedation:  Etomidate   Intended level of sedation: deep   Analgesia:  Hydromorphone   Intra-procedure monitoring:  Blood pressure monitoring, frequent LOC assessments, frequent vital sign checks, continuous pulse oximetry and cardiac monitor   Intra-procedure  events: none     Total Provider sedation time (minutes):  10 Post-procedure details:    Attendance: Constant attendance by certified staff until patient recovered     Recovery: Patient returned to pre-procedure baseline     Patient tolerance:  Tolerated well, no immediate complications    MDM  Patient presented with nail into the knee.  Unsure if intra-articular or not.  Orthopedics injected methylene blue but did not see it come out the wound however with aspiration the effusion was not able to be returned making [place] believe that it had exited the knee.  Will be taken operating room.       Davonna Belling, MD 07/20/19 [phone]

## 2019-07-20 NOTE — ED Notes (Signed)
Report given to [facility] RN in [facility]

## 2019-07-20 NOTE — H&P (Signed)
Orthopaedic Trauma Service (OTS) Consult   Patient ID: Justin Lam MRN: [id number] DOB/AGE: July 10, 1971 48 y.o.   HPI: Justin Lam is an 48 y.o. white male with history of hepatitis C who sustained an injury to his left knee earlier today while at work.  Patient works as a Theme park manager.  He sat down the nail gun and inadvertently had a discharge of the nail into his left knee (proximal tibia).  Patient was unable to bear weight and unable to move his knee.  He had to be carried down from the roof.  Patient was brought to [facility] where evaluation demonstrated retained nail in his proximal tibia with a concern for possible intra-articular violation.  Patient seen and evaluated at bedside complains only of severe left knee pain.  He is unable to straighten his knee out.  He denies any numbness or tingling in his lower extremity.  Denies any additional injuries elsewhere.  History of hepatitis C.  Does not have a regular medical care as he does not have insurance coverage.  Very pleasant otherwise and appreciated.  He has been a Theme park manager for 30+ years has been with his current company for about 3 years.  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  He has been back in [place] for a couple months now.  Nail that is embedded in the proximal tibia is a 1-1/4 inch nail.  There are some copper link that enable the nails to be joint together for loading into the nail gun which are on the nails themselves.  Patient also states that these are new nail that he had actually just taken out of the package.  Patient was wearing jeans  Past Medical History:  Diagnosis Date  . Hepatitis C     History reviewed. No pertinent surgical history.  History reviewed. No pertinent family history.  Social History:  has no history on file for tobacco, alcohol, and drug.  Allergies: No Known Allergies  Medications: I have  reviewed the patient's current medications. No outpatient medications have been marked as taking for the 07/20/19 encounter [facility] Encounter).    Results for orders placed or performed during the hospital encounter of 07/20/19 (from the past 48 hour(s))  SARS Coronavirus 2 [facility] order, Performed in [facility] hospital lab) Nasopharyngeal Nasopharyngeal Swab     Status: None   Collection Time: 07/20/19 11:55 AM   Specimen: Nasopharyngeal Swab  Result Value Ref Range   SARS Coronavirus 2 NEGATIVE NEGATIVE    Comment: (NOTE) If result is NEGATIVE SARS-CoV-2 target nucleic acids are NOT DETECTED. The SARS-CoV-2 RNA is generally detectable in upper and lower  respiratory specimens during the acute phase of infection. The lowest  concentration of SARS-CoV-2 viral copies this assay can detect is 250  copies / mL. A negative result does not preclude SARS-CoV-2 infection  and should not be used as the sole basis for treatment or other  patient management decisions.  A negative result may occur with  improper specimen collection / handling, submission of specimen other  than nasopharyngeal swab, presence of viral mutation(s) within the  areas targeted by this assay, and inadequate number of viral copies  (<250 copies / mL). A negative result must be combined with clinical  observations, patient history, and epidemiological information. If result is POSITIVE SARS-CoV-2 target nucleic acids are DETECTED. The SARS-CoV-2 RNA is generally detectable in upper and lower  respiratory specimens dur ing the acute phase of infection.  Positive  results are indicative of active infection with SARS-CoV-2.  Clinical  correlation with patient history and other diagnostic information is  necessary to determine patient infection status.  Positive results do  not rule out bacterial infection or co-infection with other viruses. If result is PRESUMPTIVE POSTIVE SARS-CoV-2 nucleic acids MAY BE PRESENT.    A presumptive positive result was obtained on the submitted specimen  and confirmed on repeat testing.  While 2019 novel coronavirus  (SARS-CoV-2) nucleic acids may be present in the submitted sample  additional confirmatory testing may be necessary for epidemiological  and / or clinical management purposes  to differentiate between  SARS-CoV-2 and other Sarbecovirus currently known to infect humans.  If clinically indicated additional testing with an alternate test  methodology [phone]) is advised. The SARS-CoV-2 RNA is generally  detectable in upper and lower respiratory sp ecimens during the acute  phase of infection. The expected result is Negative. Fact Sheet for Patients:  BoilerBrush.com.cy Fact Sheet for Healthcare Providers: https://pope.com/ This test is not yet approved or cleared by the [place] FDA and has been authorized for detection and/or diagnosis of SARS-CoV-2 by FDA under an Emergency Use Authorization (EUA).  This EUA will remain in effect (meaning this test can be used) for the duration of the COVID-19 declaration under Section 564(b)(1) of the Act, 21 U.S.C. section 360bbb-3(b)(1), unless the authorization is terminated or revoked sooner. Performed at [facility], [address] [place], [place] [address]       Dg Knee Left Port  Result Date: 07/20/2019 CLINICAL DATA:  Nail in knee EXAM: PORTABLE LEFT KNEE - 1-2 VIEW COMPARISON:  None. FINDINGS: There is AA nail along the medial aspect of the joint it is directed towards the medial joint space, oblique projections are difficult to assess in order to localize exact position. No signs of acute fracture. No signs of joint effusion. IMPRESSION: Foreign body in the soft tissues of the left knee along the medial aspect of the joint. Involvement of the joint capsule is suspected based on location. There is no current effusion or fracture. Electronically  Signed   By: Donzetta Kohut M.D.   On: 07/20/2019 11:24    Review of Systems  Constitutional: Negative for chills and fever.  Respiratory: Negative for shortness of breath and wheezing.   Cardiovascular: Negative for chest pain and palpitations.  Gastrointestinal: Negative for abdominal pain, nausea and vomiting.  Musculoskeletal: Positive for joint pain (Left knee).  Neurological: Negative for tingling and sensory change.   Blood pressure (!) 165/79, pulse 73, temperature 98 F (36.7 C), resp. rate 17, height 5\' 8"  (1.727 m), weight 63.5 kg, SpO2 99 %. Physical Exam Vitals signs and nursing note reviewed.  Constitutional:      General: He is awake. He is not in acute distress.    Comments: Slight build male, appears older than stated age, very pleasant  HENT:     Mouth/Throat:     Dentition: Abnormal dentition.  Cardiovascular:     Rate and Rhythm: Normal rate and regular rhythm.  Pulmonary:     Effort: No respiratory distress.  Musculoskeletal:     Comments: Left lower extremity Left knee is flexed to about 90 degrees There is tension on the skin around the nail Nail is in the proximal posterior medial tibia in close proximity to the joint line No knee effusion No other wounds noted DPN, SPN, TN sensory  functions are grossly intact EHL, FHL, lesser toe motor functions are intact Ankle flexion extension is intact + DP pulse Compartments are soft and nontender, no pain with passive stretching No other acute injuries identified  Skin:    General: Skin is warm.     Capillary Refill: Capillary refill takes less than 2 seconds.  Neurological:     Mental Status: He is alert and oriented to person, place, and time.  Psychiatric:        Attention and Perception: Attention normal.        Mood and Affect: Mood and affect normal.        Behavior: Behavior is cooperative.     Assessment/Plan:  48 year old male s/p nail gun injury to left knee  -Retained roofing nail left  medial proximal tibia, ?  Intra-articular violation  Plan for removal of nail at the bedside with local and sedation under EDP direction   Will then inject left knee with methylene blue to evaluate for articular violation  Patient will not have any range of motion or weightbearing restrictions postprocedure  Currently received Ancef and tetanus   If unable to remove nail and or there is articular violation we will plan to proceed to the OR for formal I&D.  Will likely then be admitted overnight for IV antibiotics and then discharged with oral antibiotics for a week  - Pain management:  Titrate accordingly - ABL anemia/Hemodynamics  Stable  - Medical issues   History of hepatitis C  - ID:   Received Ancef and tetanus  - FEN/GI prophylaxis/Foley/Lines:  N.p.o. for now  - Dispo:  Bedside removal of nail and evaluation of joint for intra-articular injury    Mearl LatinKeith W. Jerianne Anselmo, PA-C [phone] (C) 07/20/2019, 3:02 PM  [facility] [address] [place] [address] [phone] Collier Bullock(O) [phone] (F)

## 2019-07-20 NOTE — ED Notes (Signed)
Dr.Handy at bedside 

## 2019-07-20 NOTE — Sedation Documentation (Signed)
Patient denies pain and is resting comfortably.  

## 2019-07-20 NOTE — ED Provider Notes (Addendum)
[facility] Provider Note   CSN: [id number] Arrival date & time:        History   Chief Complaint Chief Complaint  Patient presents with  . Leg Injury    HPI Justin Lam is a 48 y.o. male.     Justin Lam is a 48 y.o. male with hepatitis C, otherwise healthy, who presents to the emergency department for evaluation of nail in his left knee.  Patient reports that he works as a Designer, fashion/clothingroofer, was going to set the nail gun down and his finger hit the trigger and he shot a nail into the medial aspect of his left knee.  He reports severe pain, and that he is unable to move the knee.  He as well as other people in the job site tried to remove the knee without success and he thinks it is in the bone.  He reports 10/10 pain.  No bleeding.  Tetanus updated 2 years ago.  Received 100 mcg of fentanyl with the EMS without improvement in pain.  Denies any numbness tingling or weakness in the leg.  Has had similar nail injuries in the past but he is always been able to remove the nail himself, unlike today.  No other aggravating or alleviating factors.     Past Medical History:  Diagnosis Date  . Hepatitis C     There are no active problems to display for this patient.   History reviewed. No pertinent surgical history.      Home Medications    Prior to Admission medications   Not on File    Family History History reviewed. No pertinent family history.  Social History Social History   Tobacco Use  . Smoking status: Not on file  Substance Use Topics  . Alcohol use: Not on file  . Drug use: Not on file     Allergies   Patient has no known allergies.   Review of Systems Review of Systems  Constitutional: Negative for chills and fever.  Musculoskeletal: Positive for arthralgias. Negative for joint swelling.  Skin: Positive for wound.  Neurological: Negative for weakness and numbness.  All other systems reviewed and are  negative.    Physical Exam Updated Vital Signs BP 113/70   Pulse 71   Temp 98 F (36.7 C)   Resp (!) 24   Ht 5\' 8"  (1.727 m)   Wt 63.5 kg   SpO2 99%   BMI 21.29 kg/m   Physical Exam Vitals signs and nursing note reviewed.  Constitutional:      General: He is not in acute distress.    Appearance: Normal appearance. He is well-developed and normal weight. He is not ill-appearing or diaphoretic.     Comments: Patient appears to be in severe pain, but otherwise is well-appearing  HENT:     Head: Normocephalic and atraumatic.     Mouth/Throat:     Mouth: Mucous membranes are moist.     Pharynx: Oropharynx is clear.  Eyes:     General:        Right eye: No discharge.        Left eye: No discharge.  Neck:     Musculoskeletal: Neck supple.  Cardiovascular:     Rate and Rhythm: Normal rate and regular rhythm.     Heart sounds: Normal heart sounds.  Pulmonary:     Effort: Pulmonary effort is normal. No respiratory distress.     Breath sounds: Normal breath  sounds.  Abdominal:     General: Abdomen is flat. Bowel sounds are normal. There is no distension.     Palpations: Abdomen is soft.     Tenderness: There is no abdominal tenderness. There is no guarding.  Musculoskeletal:        General: Deformity present.     Comments: 3 cm nail going into the medial aspect of the left knee at the level of the tibial plateau, unable to wiggle or move the nail at all.  Extremely tender to palpation directly surrounding the nail, no knee effusion or palpable crepitus.  No bleeding.  No erythema or warmth.  Unable to move the knee due to pain.  No pain in the lower leg.  2+ DP and PT pulse, normal sensation and strength.  Skin:    General: Skin is warm and dry.     Capillary Refill: Capillary refill takes less than 2 seconds.  Neurological:     Mental Status: He is alert and oriented to person, place, and time.     Coordination: Coordination normal.  Psychiatric:        Mood and Affect:  Mood normal.        Behavior: Behavior normal.          ED Treatments / Results  Labs (all labs ordered are listed, but only abnormal results are displayed) Labs Reviewed  SARS CORONAVIRUS 2 (HOSPITAL ORDER, [facility])    EKG None  Radiology Dg Knee Left Port  Result Date: 07/20/2019 CLINICAL DATA:  Post FB removal. Pt shot himself in the knee with a nail gun today. No hx of prior injuries or surgeries to the area.post fb removal EXAM: PORTABLE LEFT KNEE - 1-2 VIEW COMPARISON:  None. FINDINGS: Thin wirelike metallic fragment in the soft tissue medial to the medial femoral condyle. Small wire like fragment measures 7 mm. IMPRESSION: Foreign body within the soft tissues medial to the medial femoral condyle. Electronically Signed   By: Suzy Bouchard M.D.   On: 07/20/2019 14:09   Dg Knee Left Port  Result Date: 07/20/2019 CLINICAL DATA:  Nail in knee EXAM: PORTABLE LEFT KNEE - 1-2 VIEW COMPARISON:  None. FINDINGS: There is AA nail along the medial aspect of the joint it is directed towards the medial joint space, oblique projections are difficult to assess in order to localize exact position. No signs of acute fracture. No signs of joint effusion. IMPRESSION: Foreign body in the soft tissues of the left knee along the medial aspect of the joint. Involvement of the joint capsule is suspected based on location. There is no current effusion or fracture. Electronically Signed   By: Zetta Bills M.D.   On: 07/20/2019 11:24    Procedures Procedures (including critical care time)  Medications Ordered in ED Medications  ceFAZolin (ANCEF) IVPB 1 g/50 mL premix (has no administration in time range)  HYDROmorphone (DILAUDID) injection 1 mg (1 mg Intravenous Given 07/20/19 1101)     Initial Impression / Assessment and Plan / ED Course  I have reviewed the triage vital signs and the nursing notes.  Pertinent labs & imaging results that were available during my  care of the patient were reviewed by me and considered in my medical decision making (see chart for details).  48 year old male presents after shooting a nail into the medial aspect of his left knee with a nail gun while at work.  Nail enters the skin at the level of the tibial  plateau over the left medial knee there is some mild soft tissue swelling surrounding this.  Question whether or not this involves the joint capsule.  Patient is unable to straighten his leg due to pain.  Leg is neurovascularly intact.  Portable left knee x-ray shows there is a nail along the medial aspect of the joint, and directed towards the joint space, involvement of joint capsule is suspected, and while it is not explicitly stated in radiology report that there is bone involvement, on exam the nail will not wiggle at all and I do suspect that this is going into the bone of the tibia.  Tetanus updated 2 years ago, pain medication given.  1 g of Ancef given.  Consult placed to orthopedics, discussed with Dr. Carola Frost who will come and evaluate patient at bedside.  Dr. Carola Frost has examined patient and reviewed imaging, and thinks that based on the angle of the nail that may not have injured the joint capsule.  He would like to avoid taking patient to the OR if possible, will send his PA down for conscious sedation and removal of the nail, and then will plan to inject the joint with methylene blue to see if we can detect a connection between wound and joint capsule.  PA Montez Morita at bedside, patient consciously sedated with 60 mg of etomidate in total given in incremental injections.  Patient tolerated sedation well without complications.  Able to remove the nail, but concerned that there is a small fragment of copper wire left behind, this was not palpable and not able to be identified on ultrasound, but on repeat x-ray there is a small metal foreign body noted medial to the joint.  The joint was injected with methylene blue, followed by  about 20 cc of normal saline, initially obvious palpable joint effusion, and there was no obvious blue drainage from wound but joint effusion was lost and Ortho PA was unable to pull methylene blue fluid back off of the joint which raises concern that the joint is open, even if not communicating with the skin.  Additional dose of pain medication ordered.  Ortho will plan to take patient to OR.  COVID swab has returned and is negative.   Final Clinical Impressions(s) / ED Diagnoses   Final diagnoses:  Foreign body of left knee    ED Discharge Orders    None          Dartha Lodge, [place] 07/20/19 1431    Benjiman Core, MD 07/20/19 1554

## 2019-07-20 NOTE — Anesthesia Procedure Notes (Signed)
Procedure Name: Intubation Date/Time: 07/20/2019 5:22 PM Performed by: Candis Shine, CRNA Pre-anesthesia Checklist: Patient identified, Emergency Drugs available, Suction available and Patient being monitored Patient Re-evaluated:Patient Re-evaluated prior to induction Oxygen Delivery Method: Circle System Utilized Preoxygenation: Pre-oxygenation with 100% oxygen Induction Type: IV induction, Rapid sequence and Cricoid Pressure applied Ventilation: Mask ventilation without difficulty Laryngoscope Size: Mac and 4 Grade View: Grade I Tube type: Oral Tube size: 7.5 mm Number of attempts: 1 Airway Equipment and Method: Stylet Placement Confirmation: ETT inserted through vocal cords under direct vision,  positive ETCO2 and breath sounds checked- equal and bilateral Secured at: 22 cm Tube secured with: Tape Dental Injury: Teeth and Oropharynx as per pre-operative assessment

## 2019-07-21 ENCOUNTER — Encounter (HOSPITAL_COMMUNITY): Payer: Self-pay

## 2019-07-21 LAB — HIV ANTIBODY (ROUTINE TESTING W REFLEX): HIV Screen 4th Generation wRfx: NONREACTIVE

## 2019-07-21 MED ORDER — ACETAMINOPHEN 500 MG PO TABS: 500.0000 mg | ORAL_TABLET | Freq: Two times a day (BID) | ORAL | 0 refills | Status: AC

## 2019-07-21 MED ORDER — OXYCODONE-ACETAMINOPHEN 5-325 MG PO TABS: 1.0000 | ORAL_TABLET | Freq: Three times a day (TID) | ORAL | 0 refills | Status: DC | PRN

## 2019-07-21 MED ORDER — CIPROFLOXACIN HCL 500 MG PO TABS: 500.0000 mg | ORAL_TABLET | Freq: Two times a day (BID) | ORAL | 0 refills | Status: AC

## 2019-07-21 MED ORDER — DOCUSATE SODIUM 100 MG PO CAPS: 100.0000 mg | ORAL_CAPSULE | Freq: Two times a day (BID) | ORAL | 0 refills | Status: AC

## 2019-07-21 MED ORDER — ALUM & MAG HYDROXIDE-SIMETH 200-200-20 MG/5ML PO SUSP: 30.0000 mL | Freq: Three times a day (TID) | ORAL | Status: DC | PRN

## 2019-07-21 MED ORDER — METHOCARBAMOL 500 MG PO TABS: 500.0000 mg | ORAL_TABLET | Freq: Three times a day (TID) | ORAL | 0 refills | Status: AC | PRN

## 2019-07-21 NOTE — Evaluation (Signed)
Physical Therapy Evaluation Patient Details Name: Justin Lam MRN: [id number] DOB: 05-15-1971 Today's Date: 07/21/2019   History of Present Illness  48 y.o. male with nail gun injury to left knee s/p removal (WBAT). PMH significant for hepatitis C.  Clinical Impression  Pt demonstrates deficits in RLE ROM, strength, and power, with pain noted in RLE during mobility. Pt is able to complete all functional mobility without physical assistance and ambulates with supervision without an assistive device. Pt declining the need for DME upon discharge. Pt is able to perform all mobility required in the home setting and requires no further acute PT at this time. Acute PT signing off 07/21/2019.    Follow Up Recommendations No PT follow up    Equipment Recommendations  None recommended by PT(pt declining need for assistive device)    Recommendations for Other Services       Precautions / Restrictions Precautions Precautions: None Restrictions Weight Bearing Restrictions: No      Mobility  Bed Mobility Overal bed mobility: Independent                Transfers Overall transfer level: Modified independent Equipment used: Crutches             General transfer comment: sit to stand modI with crutches, sit to stand with supervision without device  Ambulation/Gait Ambulation/Gait assistance: Modified independent (Device/Increase time) Gait Distance (Feet): 120 Feet Assistive device: Crutches(crutches progressing to one crutch to no assistive device) Gait Pattern/deviations: Step-to pattern;Decreased stance time - right Gait velocity: reduced Gait velocity interpretation: <1.8 ft/sec, indicate of risk for recurrent falls General Gait Details: Pt ambulating ~100 feet with 2 crutches, 50' with one crutch and 50' without device. Increased sway without device but no LOB  Stairs            Wheelchair Mobility    Modified Rankin (Stroke Patients Only)        Balance Overall balance assessment: Needs assistance Sitting-balance support: No upper extremity supported;Feet supported Sitting balance-Leahy Scale: Good     Standing balance support: No upper extremity supported Standing balance-Leahy Scale: Fair                               Pertinent Vitals/Pain Pain Assessment: Faces Faces Pain Scale: Hurts little more Pain Location: R knee Pain Descriptors / Indicators: Aching Pain Intervention(s): Limited activity within patient's tolerance    Home Living Family/patient expects to be discharged to:: Other (Comment)                 Additional Comments: motel    Prior Function Level of Independence: Independent         Comments: working as a Scientist, research (life sciences): Right    Extremity/Trunk Assessment   Upper Extremity Assessment Upper Extremity Assessment: Overall WFL for tasks assessed    Lower Extremity Assessment Lower Extremity Assessment: RLE deficits/detail RLE Deficits / Details: knee flexion limited by ~40 degrees actively, full knee extension, strength assessment deferred 2/2 pain but at least 3/5 based on observation    Cervical / Trunk Assessment Cervical / Trunk Assessment: Normal  Communication   Communication: No difficulties  Cognition Arousal/Alertness: Awake/alert Behavior During Therapy: WFL for tasks assessed/performed Overall Cognitive Status: Within Functional Limits for tasks assessed  General Comments      Exercises General Exercises - Lower Extremity Long Arc Quad: AROM;5 reps;Right Other Exercises Other Exercises: Seated knee flexion, 5 reps RLE   Assessment/Plan    PT Assessment Patent does not need any further PT services  PT Problem List         PT Treatment Interventions      PT Goals (Current goals can be found in the Care Plan section)       Frequency     Barriers to  discharge        Co-evaluation               AM-PAC PT "6 Clicks" Mobility  Outcome Measure Help needed turning from your back to your side while in a flat bed without using bedrails?: None Help needed moving from lying on your back to sitting on the side of a flat bed without using bedrails?: None Help needed moving to and from a bed to a chair (including a wheelchair)?: None Help needed standing up from a chair using your arms (e.g., wheelchair or bedside chair)?: None Help needed to walk in hospital room?: None Help needed climbing 3-5 steps with a railing? : None 6 Click Score: 24    End of Session Equipment Utilized During Treatment: (none) Activity Tolerance: Patient tolerated treatment well Patient left: in chair;with call bell/phone within reach;with chair alarm set Nurse Communication: Mobility status PT Visit Diagnosis: Other abnormalities of gait and mobility (R26.89)    Time: 9518-8416 PT Time Calculation (min) (ACUTE ONLY): 19 min   Charges:   PT Evaluation $PT Eval Low Complexity: 1 Low          Zenaida Niece, PT, DPT Acute Rehabilitation Pager: [phone]   Zenaida Niece 07/21/2019, 9:11 AM

## 2019-07-21 NOTE — Discharge Instructions (Signed)
[facility] Discharge Instructions   General Discharge Instructions  WEIGHT BEARING STATUS: Weight-bear as tolerated left leg  RANGE OF MOTION/ACTIVITY: Unrestricted range of motion left knee and ankle, activity as tolerated  Wound Care: Daily dressing changes starting on 07/23/2019, see below  Discharge Wound Care Instructions  Do NOT apply any ointments, solutions or lotions to pin sites or surgical wounds.  These prevent needed drainage and even though solutions like hydrogen peroxide kill bacteria, they also damage cells lining the pin sites that help fight infection.  Applying lotions or ointments can keep the wounds moist and can cause them to breakdown and open up as well. This can increase the risk for infection. When in doubt call the office.  Surgical incisions should be dressed daily.  If any drainage is noted, use one layer of adaptic, then gauze, Kerlix, and an ace wrap.  Once the incision is completely dry and without drainage, it may be left open to air out.  Showering may begin 36-48 hours later.  Cleaning gently with soap and water.  Traumatic wounds should be dressed daily as well.    One layer of adaptic, gauze, Kerlix, then ace wrap.  The adaptic can be discontinued once the draining has ceased    If you have a wet to dry dressing: wet the gauze with saline the squeeze as much saline out so the gauze is moist (not soaking wet), place moistened gauze over wound, then place a dry gauze over the moist one, followed by Kerlix wrap, then ace wrap.   Diet: as you were eating previously.  Can use over the counter stool softeners and bowel preparations, such as Miralax, to help with bowel movements.  Narcotics can be constipating.  Be sure to drink plenty of fluids  PAIN MEDICATION USE AND EXPECTATIONS  You have likely been given narcotic medications to help control your pain.  After a traumatic event that results in an fracture (broken bone) with or  without surgery, it is ok to use narcotic pain medications to help control one's pain.  We understand that everyone responds to pain differently and each individual patient will be evaluated on a regular basis for the continued need for narcotic medications. Ideally, narcotic medication use should last no more than 6-8 weeks (coinciding with fracture healing).   As a patient it is your responsibility as well to monitor narcotic medication use and report the amount and frequency you use these medications when you come to your office visit.   We would also advise that if you are using narcotic medications, you should take a dose prior to therapy to maximize you participation.  IF YOU ARE ON NARCOTIC MEDICATIONS IT IS NOT PERMISSIBLE TO OPERATE A MOTOR VEHICLE (MOTORCYCLE/CAR/TRUCK/MOPED) OR HEAVY MACHINERY DO NOT MIX NARCOTICS WITH OTHER CNS (CENTRAL NERVOUS SYSTEM) DEPRESSANTS SUCH AS ALCOHOL   STOP SMOKING OR USING NICOTINE PRODUCTS!!!!  As discussed nicotine severely impairs your body's ability to heal surgical and traumatic wounds but also impairs bone healing.  Wounds and bone heal by forming microscopic blood vessels (angiogenesis) and nicotine is a vasoconstrictor (essentially, shrinks blood vessels).  Therefore, if vasoconstriction occurs to these microscopic blood vessels they essentially disappear and are unable to deliver necessary nutrients to the healing tissue.  This is one modifiable factor that you can do to dramatically increase your chances of healing your injury.    (This means no smoking, no nicotine gum, patches, etc)  DO NOT USE NONSTEROIDAL ANTI-INFLAMMATORY DRUGS (NSAID'S)  Using products such as Advil (ibuprofen), Aleve (naproxen), Motrin (ibuprofen) for additional pain control during fracture healing can delay and/or prevent the healing response.  If you would like to take over the counter (OTC) medication, Tylenol (acetaminophen) is ok.  However, some narcotic medications that  are given for pain control contain acetaminophen as well. Therefore, you should not exceed more than 4000 mg of tylenol in a day if you do not have liver disease.  Also note that there are may OTC medicines, such as cold medicines and allergy medicines that my contain tylenol as well.  If you have any questions about medications and/or interactions please ask your doctor/PA or your pharmacist.      ICE AND ELEVATE INJURED/OPERATIVE EXTREMITY  Using ice and elevating the injured extremity above your heart can help with swelling and pain control.  Icing in a pulsatile fashion, such as 20 minutes on and 20 minutes off, can be followed.    Do not place ice directly on skin. Make sure there is a barrier between to skin and the ice pack.    Using frozen items such as frozen peas works well as the conform nicely to the are that needs to be iced.  USE AN ACE WRAP OR TED HOSE FOR SWELLING CONTROL  In addition to icing and elevation, Ace wraps or TED hose are used to help limit and resolve swelling.  It is recommended to use Ace wraps or TED hose until you are informed to stop.    When using Ace Wraps start the wrapping distally (farthest away from the body) and wrap proximally (closer to the body)   Example: If you had surgery on your leg or thing and you do not have a splint on, start the ace wrap at the toes and work your way up to the thigh        If you had surgery on your upper extremity and do not have a splint on, start the ace wrap at your fingers and work your way up to the upper arm  IF YOU ARE IN A SPLINT OR CAST DO NOT REMOVE IT FOR ANY REASON   If your splint gets wet for any reason please contact the office immediately. You may shower in your splint or cast as long as you keep it dry.  This can be done by wrapping in a cast cover or garbage back (or similar)  Do Not stick any thing down your splint or cast such as pencils, money, or hangers to try and scratch yourself with.  If you feel itchy  take benadryl as prescribed on the bottle for itching  IF YOU ARE IN A CAM BOOT (BLACK BOOT)  You may remove boot periodically. Perform daily dressing changes as noted below.  Wash the liner of the boot regularly and wear a sock when wearing the boot. It is recommended that you sleep in the boot until told otherwise    Call office for the following:  Temperature greater than 101F  Persistent nausea and vomiting  Severe uncontrolled pain  Redness, tenderness, or signs of infection (pain, swelling, redness, odor or green/yellow discharge around the site)  Difficulty breathing, headache or visual disturbances  Hives  Persistent dizziness or light-headedness  Extreme fatigue  Any other questions or concerns you may have after discharge  In an emergency, call 911 or go to an Emergency Department at a nearby hospital    CALL THE OFFICE WITH ANY QUESTIONS OR CONCERNS: [phone]  VISIT OUR WEBSITE FOR ADDITIONAL INFORMATION: [email or web address]

## 2019-07-21 NOTE — Progress Notes (Signed)
Orthopedic Trauma Service Progress Note  Patient ID: Justin Lam MRN: [id number] DOB/AGE: May 05, 1971 48 y.o.  Subjective:  Doing great Feels much better Ready to go home   Worked well with therapies   ROS As above   Objective:   VITALS:   Vitals:   07/21/19 0025 07/21/19 0519 07/21/19 0737 07/21/19 1317  BP: (!) 133/91 121/74 121/70 116/81  Pulse: 63 (!) 51 62 63  Resp: 14 15 15 16   Temp: 98.2 F (36.8 C) 98.2 F (36.8 C) 97.8 F (36.6 C) 98.4 F (36.9 C)  TempSrc: Oral Oral Oral Oral  SpO2: 97% 97% 97% 97%  Weight:      Height:        Estimated body mass index is 21.29 kg/m as calculated from the following:   Height as of this encounter: 5\' 8"  (1.727 m).   Weight as of this encounter: 63.5 kg.   Intake/Output      10/01 0701 - 10/02 0700 10/02 0701 - 10/03 0700   P.O.  120   I.V. (mL/kg) 1200 (18.9) 78.9 (1.2)   IV Piggyback 50 86.9   Total Intake(mL/kg) 1250 (19.7) 285.8 (4.5)   Urine (mL/kg/hr) 250 300 (0.6)   Blood 3    Total Output 253 300   Net +997 -14.2          LABS  Results for orders placed or performed during the hospital encounter of 07/20/19 (from the past 24 hour(s))  HIV Antibody (routine testing w rflx)     Status: None   Collection Time: 07/20/19  9:26 PM  Result Value Ref Range   HIV Screen 4th Generation wRfx NON REACTIVE NON REACTIVE     PHYSICAL EXAM:   Gen: awake and alert, NAD, appears well, sitting up in bed  Lungs:unlabored Cardiac: regular  Ext:       Left Lower extremity   Dressing stable  Excellent ROM L knee and ankle  Ext warm   Swelling controlled  No DCT  Distal motor and sensory functions intact   No acute findings   Assessment/Plan: 1 Day Post-Op   Active Problems:   Foreign body of left knee   Anti-infectives (From admission, onward)   Start     Dose/Rate Route Frequency Ordered Stop   07/20/19 2100  ceFAZolin  (ANCEF) IVPB 1 g/50 mL premix     1 g 100 mL/hr over 30 Minutes Intravenous Every 6 hours 07/20/19 2055 07/21/19 0923   07/20/19 1815  ceFAZolin (ANCEF) IVPB 2g/100 mL premix  Status:  Discontinued     2 g 200 mL/hr over 30 Minutes Intravenous On call to O.R. 07/20/19 1410 07/20/19 2039   07/20/19 1625  ceFAZolin (ANCEF) 2-4 GM/100ML-% IVPB    Note to Pharmacy: 2040   : cabinet override      07/20/19 1625 07/21/19 0429   07/20/19 1230  ceFAZolin (ANCEF) IVPB 1 g/50 mL premix     1 g 100 mL/hr over 30 Minutes Intravenous  Once 07/20/19 1229 07/20/19 1331    .  POD/HD#: 1  48 y/o male s/p removal of foreign body L proximal tibia, arthroscopic I&D L knee   -nail gun injury  - removal Foreign body L proximal tibia, arthroscopic I&D L knee  WBAT  ROM as tolerated   Ice  and elevate   Dressing change starting tomorrow 07/22/2019  Follow up with ortho in 10-14 days   - Pain management:  Tylenol  Percocet  Robaxin   - ABL anemia/Hemodynamics  Stable  - DVT/PE prophylaxis:  No pharmacologics at dc  - ID:   cipro x 6 days   - Activity:  WBAT   - FEN/GI prophylaxis/Foley/Lines:  Reg diet   - Dispo:  Dc home today   Follow up with ortho in 10-14 days     Jari Pigg, PA-C [phone] (C) 07/21/2019, 2:37 PM  [facility] [place] [address] [phone] Domingo Sep (F)

## 2019-07-21 NOTE — Evaluation (Signed)
Occupational Therapy Evaluation Patient Details Name: Justin Lam MRN: [id number] DOB: 1971-06-13 Today's Date: 07/21/2019    History of Present Illness 48 y.o. male with nail gun injury to left knee s/p removal (WBAT). PMH significant for hepatitis C.   Clinical Impression   Pt is at Mod I - sup level with mobility and sup with LB ADLs. PTA, pt reports that he lived with his girlfriend and was independent with ADLs/slefcare and mobility; worked as a Theme park manager.Pt educated on LB selfcare/ADL compensatory techniques, home safety for bathroom and kitchen. Pt anxious to d/c home today. All education completed and no further acute OT is indicated at this time    Follow Up Recommendations  No OT follow up    Equipment Recommendations  Other (comment)(reacher)    Recommendations for Other Services       Precautions / Restrictions Precautions Precautions: None Restrictions Weight Bearing Restrictions: No      Mobility Bed Mobility Overal bed mobility: Independent                Transfers Overall transfer level: Modified independent Equipment used: 1 person hand held assist;Rolling walker (2 wheeled)             General transfer comment: sit to stand modI with crutches, sit to stand with supervision without device    Balance Overall balance assessment: Needs assistance Sitting-balance support: No upper extremity supported;Feet supported Sitting balance-Leahy Scale: Good     Standing balance support: No upper extremity supported;During functional activity Standing balance-Leahy Scale: Fair                             ADL either performed or assessed with clinical judgement   ADL Overall ADL's : Needs assistance/impaired Eating/Feeding: Independent;Sitting   Grooming: Wash/dry hands;Wash/dry face;Supervision/safety;Standing   Upper Body Bathing: Independent;Sitting   Lower Body Bathing: Min guard;Supervison/ safety;Sit to/from stand   Upper  Body Dressing : Independent;Sitting   Lower Body Dressing: Min guard;Supervision/safety;Sit to/from stand   Toilet Transfer: Modified Independent;Ambulation;Grab bars;Regular Museum/gallery exhibitions officer and Hygiene: Supervision/safety;Sit to/from stand       Functional mobility during ADLs: Modified independent;Supervision/safety;Rolling walker(no AD trialed as well with sup)       Vision Patient Visual Report: No change from baseline       Perception     Praxis      Pertinent Vitals/Pain Pain Assessment: 0-10 Pain Score: 3  Faces Pain Scale: Hurts little more Pain Location: R knee Pain Descriptors / Indicators: Aching;Sore Pain Intervention(s): Monitored during session;Repositioned     Hand Dominance Right   Extremity/Trunk Assessment Upper Extremity Assessment Upper Extremity Assessment: Overall WFL for tasks assessed   Lower Extremity Assessment Lower Extremity Assessment: Defer to PT evaluation RLE Deficits / Details: knee flexion limited by ~40 degrees actively, full knee extension, strength assessment deferred 2/2 pain but at least 3/5 based on observation   Cervical / Trunk Assessment Cervical / Trunk Assessment: Normal   Communication Communication Communication: No difficulties   Cognition Arousal/Alertness: Awake/alert Behavior During Therapy: WFL for tasks assessed/performed Overall Cognitive Status: Within Functional Limits for tasks assessed                                     General Comments       Exercises Exercises: General Lower Extremity;Other exercises General Exercises - Lower Extremity  Long Arc Quad: AROM;5 reps;Right Other Exercises Other Exercises: Seated knee flexion, 5 reps RLE   Shoulder Instructions      Home Living Family/patient expects to be discharged to:: Other (Comment)                                 Additional Comments: motel      Prior Functioning/Environment Level  of Independence: Independent        Comments: working as a Banker Problem List: Impaired balance (sitting and/or standing);Decreased knowledge of use of DME or AE;Pain      OT Treatment/Interventions:      OT Goals(Current goals can be found in the care plan section) Acute Rehab OT Goals Patient Stated Goal: go home and get back to work OT Goal Formulation: With patient  OT Frequency:     Barriers to D/C:            Co-evaluation              AM-PAC OT "6 Clicks" Daily Activity     Outcome Measure Help from another person eating meals?: None Help from another person taking care of personal grooming?: None Help from another person toileting, which includes using toliet, bedpan, or urinal?: A Little Help from another person bathing (including washing, rinsing, drying)?: A Little Help from another person to put on and taking off regular upper body clothing?: None Help from another person to put on and taking off regular lower body clothing?: A Little 6 Click Score: 21   End of Session Equipment Utilized During Treatment: Rolling walker  Activity Tolerance: Patient tolerated treatment well Patient left: in bed  OT Visit Diagnosis: Unsteadiness on feet (R26.81);Other abnormalities of gait and mobility (R26.89);Pain Pain - Right/Left: Left Pain - part of body: Knee                Time: 8416-6063 OT Time Calculation (min): 21 min Charges:  OT General Charges $OT Visit: 1 Visit    Galen Manila 07/21/2019, 11:09 AM

## 2019-07-21 NOTE — Discharge Summary (Signed)
Orthopaedic Trauma Service (OTS) Discharge Summary   Patient ID: OTHELL JAIME MRN: [id number] DOB/AGE: 1970-10-26 48 y.o.  Admit date: 07/20/2019 Discharge date: 07/21/2019  Admission Diagnoses: Foreign body left knee  Discharge Diagnoses:  Active Problems:   Foreign body of left knee   Past Medical History:  Diagnosis Date  . Hepatitis C      Procedures Performed:  07/20/2019- Dr. Carola Frost  Removal of foreign body left proximal tibia (roofing nail), left knee arthrocentesis in ED    101/2020- Dr. Carola Frost  1.  Exploration and removal of foreign body. 2.  Diagnostic arthroscopy, left knee.   Discharged Condition: good  Hospital Course:   Patient is a very pleasant 48 year old male who presented to the emergency department on 07/20/2019 after he inadvertently discharged a roofing nail left proximal tibia.  In the emergency department we performed a removal of the nail however a small pice of copper wire remained in the soft tissue.  We also did perform arthrocentesis of the left knee with some methylene blue which did not yield any obvious extravasation of the solution from the wound however he was concerned that there is possible intra-articular involvement.  Patient was then brought to the operating room for diagnostic arthroscopy.  We also removed the remaining piece of the copper wire from his medial soft tissue.  Patient tolerated procedure well after surgery was transferred to the PACU for recovery from anesthesia and then transferred back up to the orthopedic floor for observation and pain control.  On postoperative day #1 patient was doing fantastic his pain was markedly improved.  He worked well with therapies and was deemed to be stable for discharge home.  No other issues of note during his hospital stay.  Patient discharged in stable condition.  He was covered with IV antibiotics during perioperative period and was discharged with a 7-day course of  ciprofloxacin for further antibiotic coverage.  Consults: None  Significant Diagnostic Studies: none  Treatments: IV hydration, antibiotics: Ancef, analgesia: acetaminophen, Dilaudid and Oxy IR, therapies: PT, OT and RN and surgery: as above   Discharge Exam:  Orthopedic Trauma Service Progress Note   Patient ID: DUJUAN STANKOWSKI MRN: [id number] DOB/AGE: Feb 11, 1971 48 y.o.   Subjective:   Doing great Feels much better Ready to go home    Worked well with therapies    ROS As above     Objective:    VITALS:         Vitals:    07/21/19 0025 07/21/19 0519 07/21/19 0737 07/21/19 1317  BP: (!) 133/91 121/74 121/70 116/81  Pulse: 63 (!) 51 62 63  Resp: 14 15 15 16   Temp: 98.2 F (36.8 C) 98.2 F (36.8 C) 97.8 F (36.6 C) 98.4 F (36.9 C)  TempSrc: Oral Oral Oral Oral  SpO2: 97% 97% 97% 97%  Weight:          Height:              Estimated body mass index is 21.29 kg/m as calculated from the following:   Height as of this encounter: 5\' 8"  (1.727 m).   Weight as of this encounter: 63.5 kg.     Intake/Output      10/01 0701 - 10/02 0700 10/02 0701 - 10/03 0700   P.O.  120   I.V. (mL/kg) 1200 (18.9) 78.9 (1.2)   IV Piggyback 50 86.9   Total Intake(mL/kg) 1250 (19.7) 285.8 (4.5)   Urine (mL/kg/hr) 250 300 (0.6)  Blood 3    Total Output 253 300   Net +997 -14.2           LABS   Lab Results Last 24 Hours       Results for orders placed or performed during the hospital encounter of 07/20/19 (from the past 24 hour(s))  HIV Antibody (routine testing w rflx)     Status: None    Collection Time: 07/20/19  9:26 PM  Result Value Ref Range    HIV Screen 4th Generation wRfx NON REACTIVE NON REACTIVE         PHYSICAL EXAM:    Gen: awake and alert, NAD, appears well, sitting up in bed  Lungs:unlabored Cardiac: regular  Ext:       Left Lower extremity              Dressing stable             Excellent ROM L knee and ankle             Ext warm               Swelling controlled             No DCT             Distal motor and sensory functions intact              No acute findings    Assessment/Plan: 1 Day Post-Op    Active Problems:   Foreign body of left knee                Anti-infectives (From admission, onward)      Start     Dose/Rate Route Frequency Ordered Stop    07/20/19 2100   ceFAZolin (ANCEF) IVPB 1 g/50 mL premix     1 g 100 mL/hr over 30 Minutes Intravenous Every 6 hours 07/20/19 2055 07/21/19 0923    07/20/19 1815   ceFAZolin (ANCEF) IVPB 2g/100 mL premix  Status:  Discontinued     2 g 200 mL/hr over 30 Minutes Intravenous On call to O.R. 07/20/19 1410 07/20/19 2039    07/20/19 1625   ceFAZolin (ANCEF) 2-4 GM/100ML-% IVPB    Note to Pharmacy: Grace Blight   : cabinet override         07/20/19 1625 07/21/19 0429    07/20/19 1230   ceFAZolin (ANCEF) IVPB 1 g/50 mL premix     1 g 100 mL/hr over 30 Minutes Intravenous  Once 07/20/19 1229 07/20/19 1331       .   POD/HD#: 1   49 y/o male s/p removal of foreign body L proximal tibia, arthroscopic I&D L knee    -nail gun injury   - removal Foreign body L proximal tibia, arthroscopic I&D L knee             WBAT             ROM as tolerated              Ice and elevate              Dressing change starting tomorrow 07/22/2019             Follow up with ortho in 10-14 days    - Pain management:             Tylenol             Percocet  Robaxin    - ABL anemia/Hemodynamics             Stable   - DVT/PE prophylaxis:             No pharmacologics at dc   - ID:              cipro x 6 days    - Activity:             WBAT    - FEN/GI prophylaxis/Foley/Lines:             Reg diet    - Dispo:             Dc home today              Follow up with ortho in 10-14 days      Disposition:    Allergies as of 07/21/2019   No Known Allergies     Medication List    TAKE these medications   acetaminophen 500 MG tablet Commonly known as:  TYLENOL Take 1 tablet (500 mg total) by mouth every 12 (twelve) hours.   ciprofloxacin 500 MG tablet Commonly known as: Cipro Take 1 tablet (500 mg total) by mouth 2 (two) times daily for 7 days.   docusate sodium 100 MG capsule Commonly known as: COLACE Take 1 capsule (100 mg total) by mouth 2 (two) times daily.   methocarbamol 500 MG tablet Commonly known as: ROBAXIN Take 1-2 tablets (500-1,000 mg total) by mouth every 8 (eight) hours as needed for muscle spasms.   oxyCODONE-acetaminophen 5-325 MG tablet Commonly known as: PERCOCET/ROXICET Take 1-2 tablets by mouth every 8 (eight) hours as needed for moderate pain or severe pain.      Follow-up Information    [facility]. Call.   Why: to make an appointment to establish a primary care provider. Prescriptions are $4-$10.  Contact information: [address] [place] [phone]       Myrene GalasHandy, Michael, MD. Schedule an appointment as soon as possible for a visit in 14 day(s).   Specialty: Orthopedic Surgery Contact information: [address] [place] [address] [phone]           Discharge Instructions and Plan:  Patient discharged in stable condition Dressing changes as needed starting immediately.  Okay to clean wound with soap and water. He will follow-up with orthopedics in 10 to 14 days for suture removal He is weightbearing as tolerated on his Left leg, no range of motion restrictions Call with questions or concerns  Finish all antibiotics   Signed:  Mearl LatinKeith W. Eyla Tallon, PA-C [phone] (C) 07/21/2019, 3:06 PM  [facility] [address] [place] [address] [phone] Collier Bullock(O) [phone] (F)

## 2019-07-21 NOTE — Care Management (Signed)
CM consult acknowledged to assist with any HH/DME needs. PT has cleared for home with no HHPT; patient is declining any assistive devices.  Midge Minium RN, BSN, NCM-BC, ACM-RN [phone]

## 2019-07-21 NOTE — Progress Notes (Signed)
Provided discharge education/instructions, all questions and concerns addressed, Pt not in distress, no DME and HH needs, discharged home with belongings accompanied by friend.

## 2019-07-21 NOTE — Plan of Care (Signed)

## 2019-07-23 NOTE — Op Note (Signed)
NAME: DMARCO, BALDUS MEDICAL RECORD WU:[id number] ACCOUNT [id number] DATE OF BIRTH:16-Jun-1971 FACILITY: [facility] LOCATION: [facility] PHYSICIAN:Tiwana Chavis H. Fleda Pagel, MD  OPERATIVE REPORT  DATE OF PROCEDURE:  07/20/2019  PREOPERATIVE DIAGNOSES: 1.  Retained foreign body, right knee. 2.  Probable traumatic arthrotomy.  POSTOPERATIVE DIAGNOSES: 1.  Foreign body, right medial knee. 2.  Intact knee joint.  PROCEDURES: 1.  Exploration and removal of foreign body. 2.  Diagnostic arthroscopy, left knee.  SURGEON:  Altamese [place], MD  ASSISTANT:  None.  ANESTHESIA:  General.  COMPLICATIONS:  None.  TOURNIQUET:  None.  DISPOSITION:  To PACU.  CONDITION:  Stable.  BRIEF INDICATIONS FOR PROCEDURE:  The patient is a very Copy who sustained a nail gun injury to the left proximal tibia with impalement of the knee into the bone.  The nail itself was on a cartridge attached by additional metal  tines.  In the emergency department, we were able to remove the primary nail, but the tine remained in the soft tissue, and there also remained a question as to whether this did in fact violate the joint as it appeared to be within a centimeter of the  joint line, but the injection of saline with methylene blue into the knee joint did not egress, but then was unable to be withdrawn from the knee as well.  X-ray showed the tine present.  I did discuss with him the possibility of leaving this, the  possibility of removal only, and the more thorough or comprehensive procedure, which would be not only exploration and removal of the tine, but also washout of the knee joint as it was certainly very feasible it could have been contaminated.  The risks  including failure to prevent infection, need for further surgery, failure to identify and remove the tine, DVT, PE, and multiple others.  He provided consent to proceed.  BRIEF SUMMARY OF PROCEDURE:  The patient was taken to the operating room  where general anesthesia was induced.  His left lower extremity was prepped and draped in the usual sterile fashion.  I did place the tourniquet.  I began with the traumatic wound,  excising it and then bringing the knee into the degree of flexion where the injury occurred.  I did remove some adjacent tissue.  I was ultimately able to palpate, identify, and remove the remaining tine.  I then washed this wound out thoroughly.  It did  appear that this extended into the knee joint.  Consequently, I made a small anterior inferolateral portal and introduced the arthroscope into the knee carefully and performed a diagnostic arthroscopy.  The suprapatellar pouch did not have any foreign  material.  There was a plica, but it was not associated with any articular changes and therefore was not pathologic.  I went toward the medial gutter and closely examined the area where the nail had penetrated the bone just below the joint, and I did not  identify any contamination or break in the knee joint.  Evaluation of the medial compartment revealed it to be in outstanding condition, both the cartilage of the articular surfaces as well as the medial meniscus.  There was some small fraying of the  ligamentum in front of the ACL which was intact, and then as I went into the lateral compartment, at first there appeared to be a small tear at the junction of the middle body and posterior horn, but on probing with the probe and stretching this out, it  appeared that this  was just a degenerative fraying without any substantive tear.  I did contemplate shaving this frayed edge back so as to reduce the chance of subsequent tear, but the patient's knee was quite tight, and I did not wish to produce any  articular injury by running the shaver with questionable clearance.  Consequently, I left this in the condition in which it was found.  All fluid was evacuated from the knee, and a sterile gently compressive dressing was applied from  foot to thigh after  simple nylon closure of the portal and the traumatic wound medially.  The patient was taken to the PACU in stable condition.  PROGNOSIS:  The patient will receive 3 doses of IV antibiotics, and with the caliber wash out, I think it is likely he will have no further difficulties, though of course the potential for osteomyelitis given the contamination within the bone is  possible, and he will go out on a 10-day course of oral antibiotics as well.  He will be weightbearing as tolerated.  Will not require any formal DVT prophylaxis after discharge.  LN/NUANCE  D:07/22/2019 T:07/23/2019 JOB:[id number]

## 2020-06-29 ENCOUNTER — Inpatient Hospital Stay (HOSPITAL_COMMUNITY)
Admission: EM | Admit: 2020-06-29 | Discharge: 2020-07-02 | DRG: 522 | Disposition: A | Discharge status: Home / Self Care | Payer: Self-pay | Attending: Orthopaedic Surgery | Admitting: Orthopaedic Surgery

## 2020-06-29 ENCOUNTER — Emergency Department (HOSPITAL_COMMUNITY): Payer: Self-pay

## 2020-06-29 ENCOUNTER — Other Ambulatory Visit: Payer: Self-pay

## 2020-06-29 DIAGNOSIS — S72031A Displaced midcervical fracture of right femur, initial encounter for closed fracture: Principal | ICD-10-CM | POA: Diagnosis present

## 2020-06-29 DIAGNOSIS — Y990 Civilian activity done for income or pay: Secondary | ICD-10-CM

## 2020-06-29 DIAGNOSIS — Z20822 Contact with and (suspected) exposure to covid-19: Secondary | ICD-10-CM | POA: Diagnosis present

## 2020-06-29 DIAGNOSIS — M25051 Hemarthrosis, right hip: Secondary | ICD-10-CM | POA: Diagnosis present

## 2020-06-29 DIAGNOSIS — Z9049 Acquired absence of other specified parts of digestive tract: Secondary | ICD-10-CM

## 2020-06-29 DIAGNOSIS — F17200 Nicotine dependence, unspecified, uncomplicated: Secondary | ICD-10-CM | POA: Diagnosis present

## 2020-06-29 DIAGNOSIS — Y9389 Activity, other specified: Secondary | ICD-10-CM

## 2020-06-29 DIAGNOSIS — Z23 Encounter for immunization: Secondary | ICD-10-CM

## 2020-06-29 DIAGNOSIS — D62 Acute posthemorrhagic anemia: Secondary | ICD-10-CM | POA: Diagnosis not present

## 2020-06-29 DIAGNOSIS — Y929 Unspecified place or not applicable: Secondary | ICD-10-CM

## 2020-06-29 DIAGNOSIS — S72001A Fracture of unspecified part of neck of right femur, initial encounter for closed fracture: Secondary | ICD-10-CM | POA: Diagnosis present

## 2020-06-29 DIAGNOSIS — W132XXA Fall from, out of or through roof, initial encounter: Secondary | ICD-10-CM | POA: Diagnosis present

## 2020-06-29 DIAGNOSIS — Z96641 Presence of right artificial hip joint: Secondary | ICD-10-CM

## 2020-06-29 DIAGNOSIS — S72041A Displaced fracture of base of neck of right femur, initial encounter for closed fracture: Secondary | ICD-10-CM

## 2020-06-29 HISTORY — DX: Inflammatory liver disease, unspecified: K75.9

## 2020-06-29 LAB — COMPREHENSIVE METABOLIC PANEL
ALT: 65 U/L — ABNORMAL HIGH (ref 0–44)
AST: 72 U/L — ABNORMAL HIGH (ref 15–41)
Albumin: 4 g/dL (ref 3.5–5.0)
Alkaline Phosphatase: 46 U/L (ref 38–126)
Anion gap: 12 (ref 5–15)
BUN: 19 mg/dL (ref 6–20)
CO2: 25 mmol/L (ref 22–32)
Calcium: 9.1 mg/dL (ref 8.9–10.3)
Chloride: 104 mmol/L (ref 98–111)
Creatinine, Ser: 0.97 mg/dL (ref 0.61–1.24)
GFR calc Af Amer: >60 mL/min (ref >60)
GFR calc non Af Amer: >60 mL/min (ref >60)
Glucose, Bld: 100 mg/dL — ABNORMAL HIGH (ref 70–99)
Potassium: 5.4 mmol/L — ABNORMAL HIGH (ref 3.5–5.1)
Sodium: 141 mmol/L (ref 135–145)
Total Bilirubin: 1.3 mg/dL — ABNORMAL HIGH (ref 0.3–1.2)
Total Protein: 6.7 g/dL (ref 6.5–8.1)

## 2020-06-29 LAB — CBC
HCT: 42.8 % (ref 39.0–52.0)
Hemoglobin: 13.4 g/dL (ref 13.0–17.0)
MCH: 26.4 pg (ref 26.0–34.0)
MCHC: 31.3 g/dL (ref 30.0–36.0)
MCV: 84.4 fL (ref 80.0–100.0)
Platelets: 227 10*3/uL (ref 150–400)
RBC: 5.07 MIL/uL (ref 4.22–5.81)
RDW: 13.6 % (ref 11.5–15.5)
WBC: 7.8 10*3/uL (ref 4.0–10.5)
nRBC: 0 % (ref 0.0–0.2)

## 2020-06-29 LAB — URINALYSIS, ROUTINE W REFLEX MICROSCOPIC
Bilirubin Urine: NEGATIVE
Glucose, UA: NEGATIVE mg/dL
Hgb urine dipstick: NEGATIVE
Ketones, ur: NEGATIVE mg/dL
Leukocytes,Ua: NEGATIVE
Nitrite: NEGATIVE
Protein, ur: NEGATIVE mg/dL
Specific Gravity, Urine: 1.01 (ref 1.005–1.030)
pH: 6 (ref 5.0–8.0)

## 2020-06-29 LAB — LACTIC ACID, PLASMA: Lactic Acid, Venous: 1.2 mmol/L (ref 0.5–1.9)

## 2020-06-29 LAB — I-STAT CHEM 8, ED
BUN: 27 mg/dL — ABNORMAL HIGH (ref 6–20)
Calcium, Ion: 1.09 mmol/L — ABNORMAL LOW (ref 1.15–1.40)
Chloride: 105 mmol/L (ref 98–111)
Creatinine, Ser: 0.9 mg/dL (ref 0.61–1.24)
Glucose, Bld: 101 mg/dL — ABNORMAL HIGH (ref 70–99)
HCT: 41 % (ref 39.0–52.0)
Hemoglobin: 13.9 g/dL (ref 13.0–17.0)
Potassium: 5.4 mmol/L — ABNORMAL HIGH (ref 3.5–5.1)
Sodium: 142 mmol/L (ref 135–145)
TCO2: 27 mmol/L (ref 22–32)

## 2020-06-29 LAB — HIV ANTIBODY (ROUTINE TESTING W REFLEX): HIV Screen 4th Generation wRfx: NONREACTIVE

## 2020-06-29 LAB — SARS CORONAVIRUS 2 BY RT PCR (HOSPITAL ORDER, PERFORMED IN ~~LOC~~ HOSPITAL LAB): SARS Coronavirus 2: NEGATIVE

## 2020-06-29 LAB — SAMPLE TO BLOOD BANK

## 2020-06-29 LAB — ETHANOL: Alcohol, Ethyl (B): <10 mg/dL (ref <10)

## 2020-06-29 MED ORDER — METHOCARBAMOL 1000 MG/10ML IJ SOLN
500.0000 mg | Freq: Four times a day (QID) | INTRAVENOUS | Status: DC | PRN
  Administered 2020-06-29: 500 mg via INTRAVENOUS
  Filled 2020-06-29 (×2): qty 5

## 2020-06-29 MED ORDER — GABAPENTIN 100 MG PO CAPS
100.0000 mg | ORAL_CAPSULE | Freq: Three times a day (TID) | ORAL | Status: DC
  Administered 2020-06-29: 100 mg via ORAL
  Filled 2020-06-29: qty 1

## 2020-06-29 MED ORDER — IOHEXOL 300 MG/ML  SOLN
100.0000 mL | Freq: Once | INTRAMUSCULAR | Status: AC | PRN
  Administered 2020-06-29: 100 mL via INTRAVENOUS

## 2020-06-29 MED ORDER — ACETAMINOPHEN 325 MG PO TABS: 325.0000 mg | ORAL_TABLET | Freq: Four times a day (QID) | ORAL | Status: DC | PRN

## 2020-06-29 MED ORDER — SODIUM CHLORIDE 0.9 % IV SOLN: INTRAVENOUS | Status: DC

## 2020-06-29 MED ORDER — HYDROMORPHONE HCL 1 MG/ML IJ SOLN
0.5000 mg | INTRAMUSCULAR | Status: DC | PRN
  Administered 2020-06-29 – 2020-06-30 (×2): 1 mg via INTRAVENOUS
  Filled 2020-06-29 (×2): qty 1

## 2020-06-29 MED ORDER — OXYCODONE HCL 5 MG PO TABS
10.0000 mg | ORAL_TABLET | ORAL | Status: DC | PRN
  Administered 2020-06-29: 10 mg via ORAL
  Administered 2020-06-30: 15 mg via ORAL
  Filled 2020-06-29: qty 2
  Filled 2020-06-29: qty 3

## 2020-06-29 MED ORDER — HYDROMORPHONE HCL 1 MG/ML IJ SOLN
1.0000 mg | Freq: Once | INTRAMUSCULAR | Status: AC
  Administered 2020-06-29: 1 mg via INTRAVENOUS
  Filled 2020-06-29: qty 1

## 2020-06-29 MED ORDER — METHOCARBAMOL 500 MG PO TABS: 500.0000 mg | ORAL_TABLET | Freq: Four times a day (QID) | ORAL | Status: DC | PRN

## 2020-06-29 MED ORDER — DOCUSATE SODIUM 100 MG PO CAPS
100.0000 mg | ORAL_CAPSULE | Freq: Two times a day (BID) | ORAL | Status: DC
  Administered 2020-06-29: 100 mg via ORAL
  Filled 2020-06-29: qty 1

## 2020-06-29 MED ORDER — HYDROMORPHONE HCL 1 MG/ML IJ SOLN
1.0000 mg | Freq: Once | INTRAMUSCULAR | Status: AC
  Administered 2020-06-29: 1 mg via INTRAVENOUS

## 2020-06-29 MED ORDER — DIPHENHYDRAMINE HCL 12.5 MG/5ML PO ELIX: 12.5000 mg | ORAL_SOLUTION | ORAL | Status: DC | PRN

## 2020-06-29 MED ORDER — TETANUS-DIPHTH-ACELL PERTUSSIS 5-2.5-18.5 LF-MCG/0.5 IM SUSP
0.5000 mL | Freq: Once | INTRAMUSCULAR | Status: AC
  Administered 2020-06-29: 0.5 mL via INTRAMUSCULAR

## 2020-06-29 MED ORDER — HYDROMORPHONE HCL 1 MG/ML IJ SOLN
INTRAMUSCULAR | Status: AC
Start: 2020-06-29 — End: 2020-06-30
  Filled 2020-06-29: qty 1

## 2020-06-29 MED ORDER — OXYCODONE HCL 5 MG PO TABS: 5.0000 mg | ORAL_TABLET | ORAL | Status: DC | PRN

## 2020-06-29 NOTE — Progress Notes (Signed)
Orthopedic Tech Progress Note Patient Details:  Justin Lam June 01, 1971 [id number] Level 2 Trauma Patient ID: [name], male   DOB: [birth year], 49 y.o.   MRN: [id number]   Gerald Stabs 06/29/2020, 4:43 PM

## 2020-06-29 NOTE — Progress Notes (Signed)
   06/29/20 1800  Clinical Encounter Type  Visited With Patient  Visit Type Initial  Referral From Nurse  Consult/Referral To Chaplain  Chaplain responded to level 2 trauma. No family present. Chaplain will follow up as needed.

## 2020-06-29 NOTE — ED Provider Notes (Signed)
[facility] EMERGENCY DEPARTMENT Provider Note   CSN: [id number] Arrival date & time: 06/29/20  1607     History No chief complaint on file.   DORWIN FITZHENRY is a 49 y.o. male no syncope sickle history presents the ED today with right leg pain after a fall from a roof.  Patient states that he lost his balance and fell from a roof approximately 18 feet high, landed on his right side but struck also struck his head, unknown loss of consciousness.  Reports pain primarily to his right hip and leg.  Vital signs stable in route  The history is provided by the patient and the EMS personnel.  Trauma Mechanism of injury: fall Incident location: at work Time since incident: 30 minutes Arrived directly from scene: yes   Fall:      Fall occurred: from a roof      Height of fall: 30ft  EMS/PTA data:      Blood loss: none      Responsiveness: alert      Amnesic to event: no  Current symptoms:      Pain scale: 10/10      Pain quality: sharp      Pain timing: constant      Associated symptoms:            Denies abdominal pain, chest pain, headache and vomiting.       No past medical history on file.  There are no problems to display for this patient.   No family history on file.  Social History   Tobacco Use   Smoking status: Not on file  Substance Use Topics   Alcohol use: Not on file   Drug use: Not on file    Home Medications Prior to Admission medications   Not on File    Allergies    Patient has no allergy information on record.  Review of Systems   Review of Systems  Constitutional: Negative for chills and fever.  HENT: Negative for facial swelling and voice change.   Eyes: Negative for redness and visual disturbance.  Respiratory: Negative for cough and shortness of breath.   Cardiovascular: Negative for chest pain and palpitations.  Gastrointestinal: Negative for abdominal pain and vomiting.  Genitourinary: Negative for  difficulty urinating and dysuria.  Musculoskeletal: Negative for gait problem and joint swelling.       Positive right hip pain  Skin: Negative for rash and wound.  Neurological: Negative for dizziness and headaches.  Psychiatric/Behavioral: Negative for confusion and suicidal ideas.    Physical Exam Updated Vital Signs BP 124/75    Pulse 61    Temp 98.2 F (36.8 C) (Oral)    Resp 18    Ht 5\' 8"  (1.727 m)    Wt 61.2 kg    SpO2 91%    BMI 20.53 kg/m   Physical Exam Constitutional:      General: He is not in acute distress. HENT:     Head: Normocephalic and atraumatic.     Mouth/Throat:     Mouth: Mucous membranes are moist.     Pharynx: Oropharynx is clear.  Eyes:     General: No scleral icterus.    Pupils: Pupils are equal, round, and reactive to light.  Cardiovascular:     Rate and Rhythm: Normal rate and regular rhythm.     Pulses: Normal pulses.     Comments: Strong pulses throughout including right lower extremity Pulmonary:  Effort: Pulmonary effort is normal. No respiratory distress.  Abdominal:     General: There is no distension.     Tenderness: There is no abdominal tenderness.  Musculoskeletal:        General: No tenderness or deformity.     Cervical back: Normal range of motion and neck supple.     Comments: External rotation of the right hip, midline mid L-spine tenderness  Neurological:     General: No focal deficit present.     Mental Status: He is alert and oriented to person, place, and time.     Comments: Strength and sensation intact in the distal right lower extremity  Psychiatric:        Mood and Affect: Mood normal.        Behavior: Behavior normal.     ED Results / Procedures / Treatments   Labs (all labs ordered are listed, but only abnormal results are displayed) Labs Reviewed  COMPREHENSIVE METABOLIC PANEL - Abnormal; Notable for the following components:      Result Value   Potassium 5.4 (*)    Glucose, Bld 100 (*)    AST 72 (*)     ALT 65 (*)    Total Bilirubin 1.3 (*)    All other components within normal limits  I-STAT CHEM 8, ED - Abnormal; Notable for the following components:   Potassium 5.4 (*)    BUN 27 (*)    Glucose, Bld 101 (*)    Calcium, Ion 1.09 (*)    All other components within normal limits  SARS CORONAVIRUS 2 BY RT PCR (HOSPITAL ORDER, PERFORMED IN [facility])  CBC  ETHANOL  LACTIC ACID, PLASMA  URINALYSIS, ROUTINE W REFLEX MICROSCOPIC  SAMPLE TO BLOOD BANK    EKG None  Radiology CT HEAD WO CONTRAST  Result Date: 06/29/2020 CLINICAL DATA:  Head trauma, fell from roof, negative past medical history EXAM: CT HEAD WITHOUT CONTRAST CT CERVICAL SPINE WITHOUT CONTRAST TECHNIQUE: Multidetector CT imaging of the head and cervical spine was performed following the standard protocol without intravenous contrast. Multiplanar CT image reconstructions of the cervical spine were also generated. COMPARISON:  None FINDINGS: CT HEAD FINDINGS Brain: Normal ventricular morphology. No midline shift or mass effect. Normal appearance of brain parenchyma. No intracranial hemorrhage, mass lesion, evidence of acute infarction, or extra-axial fluid collection. Vascular: No hyperdense vessels Skull: Intact Sinuses/Orbits: Scattered mucosal thickening in ethmoid air cells. Remaining paranasal sinuses and mastoid air cells clear Other: N/A CT CERVICAL SPINE FINDINGS Alignment: Retrolisthesis at C5-C6, approximately 3 mm. Remaining alignments normal. Skull base and vertebrae: Disc space narrowing C5-C6 and C4-C5, with small associated endplate spurs. Vertebral body and remaining disc space heights maintained. Skull base intact. No fracture, additional subluxation, or bone destruction. Soft tissues and spinal canal: Prevertebral soft tissues normal thickness. 5 mm LEFT thyroid nodule; not clinically significant; no follow-up imaging recommended (ref: J Am Coll Radiol. 2015 Feb;12(2): 143-50).Remaining soft tissues  unremarkable Disc levels:  No specific abnormalities Upper chest: Lung apices clear Other: N/A IMPRESSION: No acute intracranial abnormalities. Degenerative disc disease changes at C5-C6 and C4-C5 with mild retrolisthesis at C5-C6. No acute cervical spine abnormalities. Electronically Signed   By: Ulyses Southward M.D.   On: 06/29/2020 17:20   CT CHEST W CONTRAST  Result Date: 06/29/2020 CLINICAL DATA:  Larey Seat from a roof. EXAM: CT CHEST, ABDOMEN, AND PELVIS WITH CONTRAST TECHNIQUE: Multidetector CT imaging of the chest, abdomen and pelvis was performed following the standard protocol  during bolus administration of intravenous contrast. CONTRAST:  OMNIPAQUE IOHEXOL 300 MG/ML  SOLN COMPARISON:  None. FINDINGS: CT CHEST FINDINGS Cardiovascular: The heart is normal in size. No pericardial effusion. The aorta is normal in caliber. No dissection. The branch vessels are patent. Few scattered coronary artery calcifications are noted. The pulmonary arteries appear normal. Mediastinum/Nodes: No mediastinal or hilar mass or adenopathy or hematoma. The esophagus is grossly normal. Lungs/Pleura: Streaky dependent bibasilar subpleural atelectasis but no pulmonary contusion, pneumothorax or pleural effusion. Musculoskeletal: The bony thorax is intact. No sternal, rib or thoracic vertebral body fractures. CT ABDOMEN PELVIS FINDINGS Hepatobiliary: No acute hepatic injury or perihepatic fluid collections. The bladder is surgically absent. No intra or extrahepatic biliary dilatation. Pancreas: No mass, inflammation or ductal dilatation. No acute pancreatic injury or peripancreatic fluid collection. Incidental pancreatic divisum. Spleen: Normal size. No acute splenic injury or perisplenic fluid collection. Adrenals/Urinary Tract: The adrenal glands and kidneys are unremarkable. Small renal calculi noted. No acute renal injury or perirenal fluid collection. The bladder is unremarkable. Stomach/Bowel: The stomach, duodenum, small  bowel and colon are unremarkable. No evidence of acute bowel injury. No free fluid or free air. Vascular/Lymphatic: The aorta and branch vessels are patent. The major venous structures are patent. No mesenteric or retroperitoneal mass, adenopathy or hematoma. Reproductive: The prostate gland and seminal vesicles are unremarkable. Other: There is a mixed fatty lesion projecting off the left buttock region. This may be some type of hernia or exophytic fatty lesion. It should be obvious clinically. Few surgical clips are noted in the perirectal space of uncertain etiology. Musculoskeletal: There is a displaced fracture of the right femoral neck. The pubic symphysis and SI joints are intact. No pelvic fractures. The lumbar vertebral bodies are normally aligned. No lumbar spine fractures. IMPRESSION: 1. Displaced right femoral neck fracture. 2. No other significant findings in the chest, abdomen or pelvis. 3. Mixed fatty lesion projecting off the left buttock region. This may be some type of hernia or exophytic fatty lesion. It should be obvious clinically. 4. Status post cholecystectomy. No biliary dilatation. 5. Small renal calculi. Electronically Signed   By: Rudie Meyer M.D.   On: 06/29/2020 17:22   CT CERVICAL SPINE WO CONTRAST  Result Date: 06/29/2020 CLINICAL DATA:  Head trauma, fell from roof, negative past medical history EXAM: CT HEAD WITHOUT CONTRAST CT CERVICAL SPINE WITHOUT CONTRAST TECHNIQUE: Multidetector CT imaging of the head and cervical spine was performed following the standard protocol without intravenous contrast. Multiplanar CT image reconstructions of the cervical spine were also generated. COMPARISON:  None FINDINGS: CT HEAD FINDINGS Brain: Normal ventricular morphology. No midline shift or mass effect. Normal appearance of brain parenchyma. No intracranial hemorrhage, mass lesion, evidence of acute infarction, or extra-axial fluid collection. Vascular: No hyperdense vessels Skull: Intact  Sinuses/Orbits: Scattered mucosal thickening in ethmoid air cells. Remaining paranasal sinuses and mastoid air cells clear Other: N/A CT CERVICAL SPINE FINDINGS Alignment: Retrolisthesis at C5-C6, approximately 3 mm. Remaining alignments normal. Skull base and vertebrae: Disc space narrowing C5-C6 and C4-C5, with small associated endplate spurs. Vertebral body and remaining disc space heights maintained. Skull base intact. No fracture, additional subluxation, or bone destruction. Soft tissues and spinal canal: Prevertebral soft tissues normal thickness. 5 mm LEFT thyroid nodule; not clinically significant; no follow-up imaging recommended (ref: J Am Coll Radiol. 2015 Feb;12(2): 143-50).Remaining soft tissues unremarkable Disc levels:  No specific abnormalities Upper chest: Lung apices clear Other: N/A IMPRESSION: No acute intracranial abnormalities. Degenerative disc disease changes  at C5-C6 and C4-C5 with mild retrolisthesis at C5-C6. No acute cervical spine abnormalities. Electronically Signed   By: Ulyses Southward M.D.   On: 06/29/2020 17:20   CT ABDOMEN PELVIS W CONTRAST  Result Date: 06/29/2020 CLINICAL DATA:  Larey Seat from a roof. EXAM: CT CHEST, ABDOMEN, AND PELVIS WITH CONTRAST TECHNIQUE: Multidetector CT imaging of the chest, abdomen and pelvis was performed following the standard protocol during bolus administration of intravenous contrast. CONTRAST:  OMNIPAQUE IOHEXOL 300 MG/ML  SOLN COMPARISON:  None. FINDINGS: CT CHEST FINDINGS Cardiovascular: The heart is normal in size. No pericardial effusion. The aorta is normal in caliber. No dissection. The branch vessels are patent. Few scattered coronary artery calcifications are noted. The pulmonary arteries appear normal. Mediastinum/Nodes: No mediastinal or hilar mass or adenopathy or hematoma. The esophagus is grossly normal. Lungs/Pleura: Streaky dependent bibasilar subpleural atelectasis but no pulmonary contusion, pneumothorax or pleural effusion.  Musculoskeletal: The bony thorax is intact. No sternal, rib or thoracic vertebral body fractures. CT ABDOMEN PELVIS FINDINGS Hepatobiliary: No acute hepatic injury or perihepatic fluid collections. The bladder is surgically absent. No intra or extrahepatic biliary dilatation. Pancreas: No mass, inflammation or ductal dilatation. No acute pancreatic injury or peripancreatic fluid collection. Incidental pancreatic divisum. Spleen: Normal size. No acute splenic injury or perisplenic fluid collection. Adrenals/Urinary Tract: The adrenal glands and kidneys are unremarkable. Small renal calculi noted. No acute renal injury or perirenal fluid collection. The bladder is unremarkable. Stomach/Bowel: The stomach, duodenum, small bowel and colon are unremarkable. No evidence of acute bowel injury. No free fluid or free air. Vascular/Lymphatic: The aorta and branch vessels are patent. The major venous structures are patent. No mesenteric or retroperitoneal mass, adenopathy or hematoma. Reproductive: The prostate gland and seminal vesicles are unremarkable. Other: There is a mixed fatty lesion projecting off the left buttock region. This may be some type of hernia or exophytic fatty lesion. It should be obvious clinically. Few surgical clips are noted in the perirectal space of uncertain etiology. Musculoskeletal: There is a displaced fracture of the right femoral neck. The pubic symphysis and SI joints are intact. No pelvic fractures. The lumbar vertebral bodies are normally aligned. No lumbar spine fractures. IMPRESSION: 1. Displaced right femoral neck fracture. 2. No other significant findings in the chest, abdomen or pelvis. 3. Mixed fatty lesion projecting off the left buttock region. This may be some type of hernia or exophytic fatty lesion. It should be obvious clinically. 4. Status post cholecystectomy. No biliary dilatation. 5. Small renal calculi. Electronically Signed   By: Rudie Meyer M.D.   On: 06/29/2020 17:22    DG Pelvis Portable  Result Date: 06/29/2020 CLINICAL DATA:  80 foot fall, right hip pain EXAM: PORTABLE PELVIS 1-2 VIEWS COMPARISON:  Contemporary view of the femur FINDINGS: There is a valgus angulated, mildly foreshortened transcervical right femoral neck fracture. Femoral heads remain normally located. No proximal left femoral fracture. Remaining bones of the pelvis appear intact and congruent without disruption of the sacral arcuate lines or diastasis of the symphysis pubis or SI joints. Surgical clips noted in the deep pelvis. Mild soft tissue swelling of the right hip and femur. IMPRESSION: 1. Valgus angulated, mildly foreshortened transcervical right femoral fracture. 2. No other acute osseous abnormality. Electronically Signed   By: Kreg Shropshire M.D.   On: 06/29/2020 16:56   DG Chest Portable 1 View  Result Date: 06/29/2020 CLINICAL DATA:  18 foot fall EXAM: PORTABLE CHEST 1 VIEW COMPARISON:  None FINDINGS: No visible acute  traumatic osseous injuries within the chest. Some mild atelectatic changes in the otherwise clear lungs. Normal cardiomediastinal contours for the portable technique. No pneumothorax. No effusion. Telemetry leads overlie the chest. IMPRESSION: No acute cardiopulmonary or traumatic findings in the chest. Electronically Signed   By: Kreg ShropshirePrice  DeHay M.D.   On: 06/29/2020 16:52   DG Femur Portable 1 View Right  Result Date: 06/29/2020 CLINICAL DATA:  18 foot fall EXAM: RIGHT FEMUR PORTABLE 1 VIEW COMPARISON:  Contemporary pelvic radiograph. FINDINGS: Single AP portable scout view of the right femur encompasses the femoral diaphysis and only partially includes the proximal femur and distal femoral metaphysis. No acute fracture is evident. Question some mild soft tissue swelling of the thigh predominantly laterally. Correlate for contusive change. IMPRESSION: 1. No acute osseous abnormality. 2. Soft tissue swelling of the thigh seen laterally. Correlate for contusive change. 3.  Single portable view excluding portion of the distal femur. Proximal femur better seen on pelvic radiograph. Consider dedicated completion radiography when patient is able to tolerate. Electronically Signed   By: Kreg ShropshirePrice  DeHay M.D.   On: 06/29/2020 16:54    Procedures Procedures (including critical care time)  Medications Ordered in ED Medications  HYDROmorphone (DILAUDID) 1 MG/ML injection (has no administration in time range)  HYDROmorphone (DILAUDID) injection 1 mg (1 mg Intravenous Given 06/29/20 1618)  HYDROmorphone (DILAUDID) injection 1 mg (1 mg Intravenous Given 06/29/20 1700)  Tdap (BOOSTRIX) injection 0.5 mL (0.5 mLs Intramuscular Given 06/29/20 1702)  iohexol (OMNIPAQUE) 300 MG/ML solution 100 mL (100 mLs Intravenous Contrast Given 06/29/20 1639)    ED Course  I have reviewed the triage vital signs and the nursing notes.  Pertinent labs & imaging results that were available during my care of the patient were reviewed by me and considered in my medical decision making (see chart for details).    MDM Rules/Calculators/A&P                          Possible differential diagnoses I considered included:  ICH, skull fracture, concussion, C-spine injury, thoracic trauma, abdominal trauma, spinal injury, fracture, neurovascular injury, laceration  Workup/Thought Process:  Patient presents following mechanical fall from 18 feet, striking the head with obvious right lower extremity deformity  Primary survey performed with findings above  Secondary survey performed with findings above  Vitals, labs, EKGs, and imaging were reviewed by myself, and were significant for: Vitals:   06/29/20 1700 06/29/20 1715  BP: 135/81 124/75  Pulse: 63 61  Resp: (!) 21 18  Temp:    SpO2: 93% 91%    Trauma scans including CT head, CT C-spine, CT CAP indicated particular given mechanism and midline lower spinal tenderness, significant for: Ackley significant injuries, will clear cervical  collar.  Plain films of right femur, hip, chest, pelvis significant for: Transverse femoral neck fracture with shortening  Trauma labs including CBC, CMP, urinalysis, lactic acid, indicated. Findings include: No concerning findings  Orthopedic surgery, Dr. Magnus IvanBlackman consulted for recommendations regarding right femoral neck fracture.  He agrees admit the patient to his service for operative intervention.  Patient administered Dilaudid for pain  Updated on the plan for admission.  The plan for this patient was discussed with Dr. Charm BargesButler who voiced agreement and who oversaw evaluation and treatment of this patient.   Final Clinical Impression(s) / ED Diagnoses Final diagnoses:  Closed fracture of right hip, initial encounter [facility](HCC)  Fall from roof, initial encounter    Rx / DC  Orders ED Discharge Orders    None     Labs, studies and imaging reviewed by myself and considered in medical decision making if ordered. Imaging interpreted by radiology. Pt was discussed with my attending, Dr. Charm Barges  Electronically signed by:  Christiane Ha Redding9/11/20215:42 PM       Loree Fee, MD 06/29/20 1743    Terrilee Files, MD 06/30/20 1054

## 2020-06-29 NOTE — H&P (Signed)
Justin Lam is an 49 y.o. male.   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  He apparently fell approximately 18 feet off of a roof earlier today landing directly on his right hip. He was brought in via EMS as a level 2 trauma code to the [facility] emergency room. His only complaint is right hip pain. He denies any other injuries. He had a full work-up with CT scans of his head neck as well as chest, abdomen, and pelvis. He only reports significant right hip pain. He denies any loss of consciousness. He is a smoker. He is not a diabetic. He is a thin individual. He states that he was on the job at the time of his injury.  No past medical history on file.  No family history on file. Social History:  has no history on file for tobacco use, alcohol use, and drug use.  Allergies: Not on File  (Not in a hospital admission)   Results for orders placed or performed during the hospital encounter of 06/29/20 (from the past 48 hour(s))  Sample to Blood Bank     Status: None   Collection Time: 06/29/20  4:15 PM  Result Value Ref Range   Blood Bank Specimen SAMPLE AVAILABLE FOR TESTING    Sample Expiration      06/30/2020,2359 Performed at [facility], [address], [place], [place] [address]   I-Stat Chem 8, ED     Status: Abnormal   Collection Time: 06/29/20  4:23 PM  Result Value Ref Range   Sodium 142 135 - 145 mmol/L   Potassium 5.4 (H) 3.5 - 5.1 mmol/L   Chloride 105 98 - 111 mmol/L   BUN 27 (H) 6 - 20 mg/dL    Comment: QA FLAGS AND/OR RANGES MODIFIED BY DEMOGRAPHIC UPDATE ON 09/11 AT 1646   Creatinine, Ser 0.90 0.61 - 1.24 mg/dL   Glucose, Bld 604 (H) 70 - 99 mg/dL    Comment: Glucose reference range applies only to samples taken after fasting for at least 8 hours.   Calcium, Ion 1.09 (L) 1.15 - 1.40 mmol/L   TCO2 27 22 - 32 mmol/L   Hemoglobin 13.9 13.0 - 17.0 g/dL   HCT 54.0 39 - 52 %   Comprehensive metabolic panel     Status: Abnormal   Collection Time: 06/29/20  4:28 PM  Result Value Ref Range   Sodium 141 135 - 145 mmol/L   Potassium 5.4 (H) 3.5 - 5.1 mmol/L    Comment: HEMOLYSIS AT THIS LEVEL MAY AFFECT RESULT   Chloride 104 98 - 111 mmol/L   CO2 25 22 - 32 mmol/L   Glucose, Bld 100 (H) 70 - 99 mg/dL    Comment: Glucose reference range applies only to samples taken after fasting for at least 8 hours.   BUN 19 6 - 20 mg/dL   Creatinine, Ser 9.81 0.61 - 1.24 mg/dL   Calcium 9.1 8.9 - 19.1 mg/dL   Total Protein 6.7 6.5 - 8.1 g/dL   Albumin 4.0 3.5 - 5.0 g/dL   AST 72 (H) 15 - 41 U/L   ALT 65 (H) 0 - 44 U/L   Alkaline Phosphatase 46 38 - 126 U/L   Total Bilirubin 1.3 (H) 0.3 - 1.2 mg/dL   GFR calc non Af Amer >60 >60 mL/min   GFR calc Af Amer >  60 >60 mL/min   Anion gap 12 5 - 15    Comment: Performed at [facility], [address], [place], [place] [address]  CBC     Status: None   Collection Time: 06/29/20  4:28 PM  Result Value Ref Range   WBC 7.8 4.0 - 10.5 K/uL   RBC 5.07 4.22 - 5.81 MIL/uL   Hemoglobin 13.4 13.0 - 17.0 g/dL   HCT 47.8 39 - 52 %   MCV 84.4 80.0 - 100.0 fL   MCH 26.4 26.0 - 34.0 pg   MCHC 31.3 30.0 - 36.0 g/dL   RDW 29.5 62.1 - 30.8 %   Platelets 227 150 - 400 K/uL   nRBC 0.0 0.0 - 0.2 %    Comment: Performed at [facility], [address], [place], [place] [address]  Ethanol     Status: None   Collection Time: 06/29/20  4:28 PM  Result Value Ref Range   Alcohol, Ethyl (B) <10 <10 mg/dL    Comment: (NOTE) Lowest detectable limit for serum alcohol is 10 mg/dL.  For medical purposes only. Performed at [facility], [address], [place], [place] [address]   Lactic acid, plasma     Status: None   Collection Time: 06/29/20  4:28 PM  Result Value Ref Range   Lactic Acid, Venous 1.2 0.5 - 1.9 mmol/L    Comment: Performed at [facility], [address], [place], [place] [address]   CT HEAD WO  CONTRAST  Result Date: 06/29/2020 CLINICAL DATA:  Head trauma, fell from roof, negative past medical history EXAM: CT HEAD WITHOUT CONTRAST CT CERVICAL SPINE WITHOUT CONTRAST TECHNIQUE: Multidetector CT imaging of the head and cervical spine was performed following the standard protocol without intravenous contrast. Multiplanar CT image reconstructions of the cervical spine were also generated. COMPARISON:  None FINDINGS: CT HEAD FINDINGS Brain: Normal ventricular morphology. No midline shift or mass effect. Normal appearance of brain parenchyma. No intracranial hemorrhage, mass lesion, evidence of acute infarction, or extra-axial fluid collection. Vascular: No hyperdense vessels Skull: Intact Sinuses/Orbits: Scattered mucosal thickening in ethmoid air cells. Remaining paranasal sinuses and mastoid air cells clear Other: N/A CT CERVICAL SPINE FINDINGS Alignment: Retrolisthesis at C5-C6, approximately 3 mm. Remaining alignments normal. Skull base and vertebrae: Disc space narrowing C5-C6 and C4-C5, with small associated endplate spurs. Vertebral body and remaining disc space heights maintained. Skull base intact. No fracture, additional subluxation, or bone destruction. Soft tissues and spinal canal: Prevertebral soft tissues normal thickness. 5 mm LEFT thyroid nodule; not clinically significant; no follow-up imaging recommended (ref: J Am Coll Radiol. 2015 Feb;12(2): 143-50).Remaining soft tissues unremarkable Disc levels:  No specific abnormalities Upper chest: Lung apices clear Other: N/A IMPRESSION: No acute intracranial abnormalities. Degenerative disc disease changes at C5-C6 and C4-C5 with mild retrolisthesis at C5-C6. No acute cervical spine abnormalities. Electronically Signed   By: Ulyses Southward M.D.   On: 06/29/2020 17:20   CT CHEST W CONTRAST  Result Date: 06/29/2020 CLINICAL DATA:  Larey Seat from a roof. EXAM: CT CHEST, ABDOMEN, AND PELVIS WITH CONTRAST TECHNIQUE: Multidetector CT imaging of the chest,  abdomen and pelvis was performed following the standard protocol during bolus administration of intravenous contrast. CONTRAST:  OMNIPAQUE IOHEXOL 300 MG/ML  SOLN COMPARISON:  None. FINDINGS: CT CHEST FINDINGS Cardiovascular: The heart is normal in size. No pericardial effusion. The aorta is normal in caliber. No dissection. The branch vessels are patent. Few scattered coronary  artery calcifications are noted. The pulmonary arteries appear normal. Mediastinum/Nodes: No mediastinal or hilar mass or adenopathy or hematoma. The esophagus is grossly normal. Lungs/Pleura: Streaky dependent bibasilar subpleural atelectasis but no pulmonary contusion, pneumothorax or pleural effusion. Musculoskeletal: The bony thorax is intact. No sternal, rib or thoracic vertebral body fractures. CT ABDOMEN PELVIS FINDINGS Hepatobiliary: No acute hepatic injury or perihepatic fluid collections. The bladder is surgically absent. No intra or extrahepatic biliary dilatation. Pancreas: No mass, inflammation or ductal dilatation. No acute pancreatic injury or peripancreatic fluid collection. Incidental pancreatic divisum. Spleen: Normal size. No acute splenic injury or perisplenic fluid collection. Adrenals/Urinary Tract: The adrenal glands and kidneys are unremarkable. Small renal calculi noted. No acute renal injury or perirenal fluid collection. The bladder is unremarkable. Stomach/Bowel: The stomach, duodenum, small bowel and colon are unremarkable. No evidence of acute bowel injury. No free fluid or free air. Vascular/Lymphatic: The aorta and branch vessels are patent. The major venous structures are patent. No mesenteric or retroperitoneal mass, adenopathy or hematoma. Reproductive: The prostate gland and seminal vesicles are unremarkable. Other: There is a mixed fatty lesion projecting off the left buttock region. This may be some type of hernia or exophytic fatty lesion. It should be obvious clinically. Few surgical clips are  noted in the perirectal space of uncertain etiology. Musculoskeletal: There is a displaced fracture of the right femoral neck. The pubic symphysis and SI joints are intact. No pelvic fractures. The lumbar vertebral bodies are normally aligned. No lumbar spine fractures. IMPRESSION: 1. Displaced right femoral neck fracture. 2. No other significant findings in the chest, abdomen or pelvis. 3. Mixed fatty lesion projecting off the left buttock region. This may be some type of hernia or exophytic fatty lesion. It should be obvious clinically. 4. Status post cholecystectomy. No biliary dilatation. 5. Small renal calculi. Electronically Signed   By: Rudie Meyer M.D.   On: 06/29/2020 17:22   CT CERVICAL SPINE WO CONTRAST  Result Date: 06/29/2020 CLINICAL DATA:  Head trauma, fell from roof, negative past medical history EXAM: CT HEAD WITHOUT CONTRAST CT CERVICAL SPINE WITHOUT CONTRAST TECHNIQUE: Multidetector CT imaging of the head and cervical spine was performed following the standard protocol without intravenous contrast. Multiplanar CT image reconstructions of the cervical spine were also generated. COMPARISON:  None FINDINGS: CT HEAD FINDINGS Brain: Normal ventricular morphology. No midline shift or mass effect. Normal appearance of brain parenchyma. No intracranial hemorrhage, mass lesion, evidence of acute infarction, or extra-axial fluid collection. Vascular: No hyperdense vessels Skull: Intact Sinuses/Orbits: Scattered mucosal thickening in ethmoid air cells. Remaining paranasal sinuses and mastoid air cells clear Other: N/A CT CERVICAL SPINE FINDINGS Alignment: Retrolisthesis at C5-C6, approximately 3 mm. Remaining alignments normal. Skull base and vertebrae: Disc space narrowing C5-C6 and C4-C5, with small associated endplate spurs. Vertebral body and remaining disc space heights maintained. Skull base intact. No fracture, additional subluxation, or bone destruction. Soft tissues and spinal canal:  Prevertebral soft tissues normal thickness. 5 mm LEFT thyroid nodule; not clinically significant; no follow-up imaging recommended (ref: J Am Coll Radiol. 2015 Feb;12(2): 143-50).Remaining soft tissues unremarkable Disc levels:  No specific abnormalities Upper chest: Lung apices clear Other: N/A IMPRESSION: No acute intracranial abnormalities. Degenerative disc disease changes at C5-C6 and C4-C5 with mild retrolisthesis at C5-C6. No acute cervical spine abnormalities. Electronically Signed   By: Ulyses Southward M.D.   On: 06/29/2020 17:20   CT ABDOMEN PELVIS W CONTRAST  Result Date: 06/29/2020 CLINICAL DATA:  Larey Seat from a roof. EXAM: CT  CHEST, ABDOMEN, AND PELVIS WITH CONTRAST TECHNIQUE: Multidetector CT imaging of the chest, abdomen and pelvis was performed following the standard protocol during bolus administration of intravenous contrast. CONTRAST:  100mL OMNIPAQUE IOHEXOL 300 MG/ML  SOLN COMPARISON:  None. FINDINGS: CT CHEST FINDINGS Cardiovascular: The heart is normal in size. No pericardial effusion. The aorta is normal in caliber. No dissection. The branch vessels are patent. Few scattered coronary artery calcifications are noted. The pulmonary arteries appear normal. Mediastinum/Nodes: No mediastinal or hilar mass or adenopathy or hematoma. The esophagus is grossly normal. Lungs/Pleura: Streaky dependent bibasilar subpleural atelectasis but no pulmonary contusion, pneumothorax or pleural effusion. Musculoskeletal: The bony thorax is intact. No sternal, rib or thoracic vertebral body fractures. CT ABDOMEN PELVIS FINDINGS Hepatobiliary: No acute hepatic injury or perihepatic fluid collections. The bladder is surgically absent. No intra or extrahepatic biliary dilatation. Pancreas: No mass, inflammation or ductal dilatation. No acute pancreatic injury or peripancreatic fluid collection. Incidental pancreatic divisum. Spleen: Normal size. No acute splenic injury or perisplenic fluid collection. Adrenals/Urinary  Tract: The adrenal glands and kidneys are unremarkable. Small renal calculi noted. No acute renal injury or perirenal fluid collection. The bladder is unremarkable. Stomach/Bowel: The stomach, duodenum, small bowel and colon are unremarkable. No evidence of acute bowel injury. No free fluid or free air. Vascular/Lymphatic: The aorta and branch vessels are patent. The major venous structures are patent. No mesenteric or retroperitoneal mass, adenopathy or hematoma. Reproductive: The prostate gland and seminal vesicles are unremarkable. Other: There is a mixed fatty lesion projecting off the left buttock region. This may be some type of hernia or exophytic fatty lesion. It should be obvious clinically. Few surgical clips are noted in the perirectal space of uncertain etiology. Musculoskeletal: There is a displaced fracture of the right femoral neck. The pubic symphysis and SI joints are intact. No pelvic fractures. The lumbar vertebral bodies are normally aligned. No lumbar spine fractures. IMPRESSION: 1. Displaced right femoral neck fracture. 2. No other significant findings in the chest, abdomen or pelvis. 3. Mixed fatty lesion projecting off the left buttock region. This may be some type of hernia or exophytic fatty lesion. It should be obvious clinically. 4. Status post cholecystectomy. No biliary dilatation. 5. Small renal calculi. Electronically Signed   By: Rudie MeyerP.  Gallerani M.D.   On: 06/29/2020 17:22   DG Pelvis Portable  Result Date: 06/29/2020 CLINICAL DATA:  80 foot fall, right hip pain EXAM: PORTABLE PELVIS 1-2 VIEWS COMPARISON:  Contemporary view of the femur FINDINGS: There is a valgus angulated, mildly foreshortened transcervical right femoral neck fracture. Femoral heads remain normally located. No proximal left femoral fracture. Remaining bones of the pelvis appear intact and congruent without disruption of the sacral arcuate lines or diastasis of the symphysis pubis or SI joints. Surgical clips  noted in the deep pelvis. Mild soft tissue swelling of the right hip and femur. IMPRESSION: 1. Valgus angulated, mildly foreshortened transcervical right femoral fracture. 2. No other acute osseous abnormality. Electronically Signed   By: Kreg ShropshirePrice  DeHay M.D.   On: 06/29/2020 16:56   DG Chest Portable 1 View  Result Date: 06/29/2020 CLINICAL DATA:  18 foot fall EXAM: PORTABLE CHEST 1 VIEW COMPARISON:  None FINDINGS: No visible acute traumatic osseous injuries within the chest. Some mild atelectatic changes in the otherwise clear lungs. Normal cardiomediastinal contours for the portable technique. No pneumothorax. No effusion. Telemetry leads overlie the chest. IMPRESSION: No acute cardiopulmonary or traumatic findings in the chest. Electronically Signed   By: Samuella CotaPrice  [facility] M.D.   On: 06/29/2020 16:52   DG Hip Unilat W or Wo Pelvis 2-3 Views Right  Result Date: 06/29/2020 CLINICAL DATA:  RIGHT hip pain, fall from roof EXAM: DG HIP (WITH OR WITHOUT PELVIS) 2-3V RIGHT COMPARISON:  CT study of 06/29/2020 FINDINGS: Displaced RIGHT femoral neck fracture as on the previous CT. Femoral head remains located. No sign of pelvic fracture. Excreted contrast in the urinary bladder. IMPRESSION: Displaced RIGHT femoral neck fracture as on the recent CT. Electronically Signed   By: Donzetta Kohut M.D.   On: 06/29/2020 17:59   DG Femur Portable 1 View Right  Result Date: 06/29/2020 CLINICAL DATA:  18 foot fall EXAM: RIGHT FEMUR PORTABLE 1 VIEW COMPARISON:  Contemporary pelvic radiograph. FINDINGS: Single AP portable scout view of the right femur encompasses the femoral diaphysis and only partially includes the proximal femur and distal femoral metaphysis. No acute fracture is evident. Question some mild soft tissue swelling of the thigh predominantly laterally. Correlate for contusive change. IMPRESSION: 1. No acute osseous abnormality. 2. Soft tissue swelling of the thigh seen laterally. Correlate for contusive change. 3.  Single portable view excluding portion of the distal femur. Proximal femur better seen on pelvic radiograph. Consider dedicated completion radiography when patient is able to tolerate. Electronically Signed   By: Kreg Shropshire M.D.   On: 06/29/2020 16:54   I did independently review the CT scan of his pelvis as well as the plain films showing a comminuted and displaced right hip femoral neck fracture.   Review of Systems  Blood pressure 126/79, pulse 60, temperature 98.2 F (36.8 C), temperature source Oral, resp. rate 12, height 5\' 8"  (1.727 m), weight 61.2 kg, SpO2 92 %. Physical Exam Vitals reviewed.  Constitutional:      Appearance: Normal appearance.  HENT:     Head: Normocephalic and atraumatic.  Eyes:     Extraocular Movements: Extraocular movements intact.     Pupils: Pupils are equal, round, and reactive to light.  Cardiovascular:     Rate and Rhythm: Normal rate and regular rhythm.     Pulses: Normal pulses.     Heart sounds: Normal heart sounds.  Pulmonary:     Effort: Pulmonary effort is normal.     Breath sounds: Normal breath sounds.  Abdominal:     Palpations: Abdomen is soft.  Musculoskeletal:     Cervical back: Normal range of motion and neck supple.     Right hip: Deformity, tenderness and bony tenderness present. Decreased range of motion. Decreased strength.  Neurological:     Mental Status: He is alert and oriented to person, place, and time.  Psychiatric:        Behavior: Behavior normal.   His neck is nontender to palpation with normal range of motion. His pelvis is stable to AP lateral compression with only pain over the right hip area. There is no tenderness to palpation along the thoracic or lumbar spine but it is difficult to roll him some due to the significant right hip pain. His right foot shows normal sensation and is well-perfused. He is able to move his toes and his ankle.  Assessment/Plan Closed, displaced and comminuted right femoral neck  fracture  He will be admitted this evening to the orthopedic service. I did talk to him in detail about treatment options. One option would be an open reduction and internal fixation of the hip fracture. Another option would be a right direct anterior total hip arthroplasty. I talked him  about the treatment plan of both of these as well as the risk and benefits involved with both types of injuries. Based on the CT scan and the high-energy nature of this fracture, I would recommend a direct anterior hip replacement in order to get him up and moving and more mobile quicker. I described in detail the risk and benefits of both surgeries. Our plan is to proceed to the OR first thing in the morning. All questions and concerns were answered and addressed.  Kathryne Hitch, MD 06/29/2020, 6:08 PM

## 2020-06-29 NOTE — ED Notes (Signed)
Pt repositioned in bed, c/o severe pain to R hip and also reports nausea. Meal tray at bedside

## 2020-06-29 NOTE — ED Notes (Signed)
MS Dinner Ordered

## 2020-06-30 ENCOUNTER — Encounter (HOSPITAL_COMMUNITY): Payer: Self-pay | Admitting: Orthopaedic Surgery

## 2020-06-30 ENCOUNTER — Inpatient Hospital Stay (HOSPITAL_COMMUNITY): Payer: Self-pay

## 2020-06-30 ENCOUNTER — Encounter (HOSPITAL_COMMUNITY)
Admission: EM | Disposition: A | Discharge status: Home / Self Care | Payer: Self-pay | Source: Home / Self Care | Attending: Orthopaedic Surgery

## 2020-06-30 ENCOUNTER — Inpatient Hospital Stay (HOSPITAL_COMMUNITY): Payer: Self-pay | Admitting: Anesthesiology

## 2020-06-30 HISTORY — PX: TOTAL KNEE ARTHROPLASTY: SHX125

## 2020-06-30 HISTORY — PX: TOTAL HIP ARTHROPLASTY: SHX124

## 2020-06-30 LAB — SURGICAL PCR SCREEN
MRSA, PCR: NEGATIVE
Staphylococcus aureus: NEGATIVE

## 2020-06-30 SURGERY — ARTHROPLASTY, HIP, TOTAL, ANTERIOR APPROACH
Anesthesia: General | Site: Hip | Laterality: Right

## 2020-06-30 MED ORDER — AMISULPRIDE (ANTIEMETIC) 5 MG/2ML IV SOLN: 10.0000 mg | Freq: Once | INTRAVENOUS | Status: DC | PRN

## 2020-06-30 MED ORDER — HYDRALAZINE HCL 20 MG/ML IJ SOLN
INTRAMUSCULAR | Status: DC | PRN
  Administered 2020-06-30: 2.5 mg via INTRAVENOUS

## 2020-06-30 MED ORDER — CHLORHEXIDINE GLUCONATE 0.12 % MT SOLN: 15.0000 mL | Freq: Once | OROMUCOSAL | Status: AC

## 2020-06-30 MED ORDER — DOCUSATE SODIUM 100 MG PO CAPS
100.0000 mg | ORAL_CAPSULE | Freq: Two times a day (BID) | ORAL | Status: DC
  Administered 2020-06-30 – 2020-07-02 (×5): 100 mg via ORAL
  Filled 2020-06-30 (×5): qty 1

## 2020-06-30 MED ORDER — CELECOXIB 200 MG PO CAPS: 200.0000 mg | ORAL_CAPSULE | Freq: Once | ORAL | Status: AC

## 2020-06-30 MED ORDER — CELECOXIB 200 MG PO CAPS
ORAL_CAPSULE | ORAL | Status: AC
  Administered 2020-06-30: 200 mg via ORAL
  Filled 2020-06-30: qty 1

## 2020-06-30 MED ORDER — DIPHENHYDRAMINE HCL 12.5 MG/5ML PO ELIX: 12.5000 mg | ORAL_SOLUTION | ORAL | Status: DC | PRN

## 2020-06-30 MED ORDER — MIDAZOLAM HCL 2 MG/2ML IJ SOLN
INTRAMUSCULAR | Status: DC | PRN
  Administered 2020-06-30: 2 mg via INTRAVENOUS

## 2020-06-30 MED ORDER — ONDANSETRON HCL 4 MG PO TABS
4.0000 mg | ORAL_TABLET | Freq: Four times a day (QID) | ORAL | Status: DC | PRN
  Filled 2020-06-30: qty 1

## 2020-06-30 MED ORDER — SODIUM CHLORIDE 0.9 % IV SOLN: INTRAVENOUS | Status: DC

## 2020-06-30 MED ORDER — SUGAMMADEX SODIUM 200 MG/2ML IV SOLN
INTRAVENOUS | Status: DC | PRN
  Administered 2020-06-30: 120 mg via INTRAVENOUS

## 2020-06-30 MED ORDER — HYDRALAZINE HCL 20 MG/ML IJ SOLN
INTRAMUSCULAR | Status: AC
  Filled 2020-06-30: qty 1

## 2020-06-30 MED ORDER — LIDOCAINE 2% (20 MG/ML) 5 ML SYRINGE
INTRAMUSCULAR | Status: AC
  Filled 2020-06-30: qty 5

## 2020-06-30 MED ORDER — PANTOPRAZOLE SODIUM 40 MG PO TBEC
40.0000 mg | DELAYED_RELEASE_TABLET | Freq: Every day | ORAL | Status: DC
  Administered 2020-06-30 – 2020-07-02 (×3): 40 mg via ORAL
  Filled 2020-06-30 (×3): qty 1

## 2020-06-30 MED ORDER — GABAPENTIN 100 MG PO CAPS
100.0000 mg | ORAL_CAPSULE | Freq: Three times a day (TID) | ORAL | Status: DC
  Administered 2020-06-30 – 2020-07-02 (×7): 100 mg via ORAL
  Filled 2020-06-30 (×7): qty 1

## 2020-06-30 MED ORDER — OXYCODONE HCL 5 MG PO TABS
10.0000 mg | ORAL_TABLET | ORAL | Status: DC | PRN
  Administered 2020-07-01: 15 mg via ORAL
  Filled 2020-06-30: qty 3
  Filled 2020-06-30: qty 2

## 2020-06-30 MED ORDER — DEXAMETHASONE SODIUM PHOSPHATE 10 MG/ML IJ SOLN
INTRAMUSCULAR | Status: AC
  Filled 2020-06-30: qty 1

## 2020-06-30 MED ORDER — MIDAZOLAM HCL 2 MG/2ML IJ SOLN
INTRAMUSCULAR | Status: AC
  Filled 2020-06-30: qty 2

## 2020-06-30 MED ORDER — PROPOFOL 10 MG/ML IV BOLUS
INTRAVENOUS | Status: AC
  Filled 2020-06-30: qty 20

## 2020-06-30 MED ORDER — FENTANYL CITRATE (PF) 250 MCG/5ML IJ SOLN
INTRAMUSCULAR | Status: DC | PRN
Start: 2020-06-30 — End: 2020-06-30
  Administered 2020-06-30: 100 ug via INTRAVENOUS
  Administered 2020-06-30: 50 ug via INTRAVENOUS
  Administered 2020-06-30: 100 ug via INTRAVENOUS

## 2020-06-30 MED ORDER — ACETAMINOPHEN 325 MG PO TABS
325.0000 mg | ORAL_TABLET | Freq: Four times a day (QID) | ORAL | Status: DC | PRN
  Administered 2020-07-01 (×2): 325 mg via ORAL
  Administered 2020-07-02 (×2): 650 mg via ORAL
  Filled 2020-06-30 (×2): qty 2
  Filled 2020-06-30 (×2): qty 1

## 2020-06-30 MED ORDER — POLYETHYLENE GLYCOL 3350 17 G PO PACK: 17.0000 g | PACK | Freq: Every day | ORAL | Status: DC | PRN

## 2020-06-30 MED ORDER — ORAL CARE MOUTH RINSE: 15.0000 mL | Freq: Once | OROMUCOSAL | Status: AC

## 2020-06-30 MED ORDER — ALUM & MAG HYDROXIDE-SIMETH 200-200-20 MG/5ML PO SUSP: 30.0000 mL | ORAL | Status: DC | PRN

## 2020-06-30 MED ORDER — MENTHOL 3 MG MT LOZG: 1.0000 | LOZENGE | OROMUCOSAL | Status: DC | PRN

## 2020-06-30 MED ORDER — CHLORHEXIDINE GLUCONATE 0.12 % MT SOLN
OROMUCOSAL | Status: AC
  Administered 2020-06-30: 15 mL via OROMUCOSAL
  Filled 2020-06-30: qty 15

## 2020-06-30 MED ORDER — METHOCARBAMOL 1000 MG/10ML IJ SOLN
500.0000 mg | Freq: Four times a day (QID) | INTRAVENOUS | Status: DC | PRN
  Filled 2020-06-30: qty 5

## 2020-06-30 MED ORDER — CEFAZOLIN SODIUM-DEXTROSE 2-3 GM-%(50ML) IV SOLR
INTRAVENOUS | Status: DC | PRN
  Administered 2020-06-30: 2 g via INTRAVENOUS

## 2020-06-30 MED ORDER — DEXAMETHASONE SODIUM PHOSPHATE 10 MG/ML IJ SOLN
INTRAMUSCULAR | Status: DC | PRN
  Administered 2020-06-30: 10 mg via INTRAVENOUS

## 2020-06-30 MED ORDER — ONDANSETRON HCL 4 MG/2ML IJ SOLN
INTRAMUSCULAR | Status: DC | PRN
  Administered 2020-06-30: 4 mg via INTRAVENOUS

## 2020-06-30 MED ORDER — FENTANYL CITRATE (PF) 250 MCG/5ML IJ SOLN
INTRAMUSCULAR | Status: AC
  Filled 2020-06-30: qty 5

## 2020-06-30 MED ORDER — PHENOL 1.4 % MT LIQD: 1.0000 | OROMUCOSAL | Status: DC | PRN

## 2020-06-30 MED ORDER — ONDANSETRON HCL 4 MG/2ML IJ SOLN
INTRAMUSCULAR | Status: AC
  Filled 2020-06-30: qty 2

## 2020-06-30 MED ORDER — ACETAMINOPHEN 500 MG PO TABS: 1000.0000 mg | ORAL_TABLET | Freq: Once | ORAL | Status: DC

## 2020-06-30 MED ORDER — METHOCARBAMOL 500 MG PO TABS
500.0000 mg | ORAL_TABLET | Freq: Four times a day (QID) | ORAL | Status: DC | PRN
  Administered 2020-06-30 – 2020-07-01 (×3): 500 mg via ORAL
  Filled 2020-06-30 (×3): qty 1

## 2020-06-30 MED ORDER — ROCURONIUM BROMIDE 10 MG/ML (PF) SYRINGE
PREFILLED_SYRINGE | INTRAVENOUS | Status: AC
  Filled 2020-06-30: qty 10

## 2020-06-30 MED ORDER — CEFAZOLIN SODIUM-DEXTROSE 1-4 GM/50ML-% IV SOLN
1.0000 g | Freq: Four times a day (QID) | INTRAVENOUS | Status: AC
  Administered 2020-06-30: 1 g via INTRAVENOUS
  Filled 2020-06-30 (×2): qty 50

## 2020-06-30 MED ORDER — LACTATED RINGERS IV SOLN: INTRAVENOUS | Status: DC

## 2020-06-30 MED ORDER — PROPOFOL 10 MG/ML IV BOLUS
INTRAVENOUS | Status: DC | PRN
  Administered 2020-06-30: 150 mg via INTRAVENOUS

## 2020-06-30 MED ORDER — PHENYLEPHRINE 40 MCG/ML (10ML) SYRINGE FOR IV PUSH (FOR BLOOD PRESSURE SUPPORT)
PREFILLED_SYRINGE | INTRAVENOUS | Status: AC
  Filled 2020-06-30: qty 10

## 2020-06-30 MED ORDER — 0.9 % SODIUM CHLORIDE (POUR BTL) OPTIME
TOPICAL | Status: DC | PRN
  Administered 2020-06-30: 2000 mL

## 2020-06-30 MED ORDER — ONDANSETRON HCL 4 MG/2ML IJ SOLN
4.0000 mg | Freq: Four times a day (QID) | INTRAMUSCULAR | Status: DC | PRN
  Filled 2020-06-30: qty 2

## 2020-06-30 MED ORDER — SUCCINYLCHOLINE CHLORIDE 200 MG/10ML IV SOSY
PREFILLED_SYRINGE | INTRAVENOUS | Status: AC
  Filled 2020-06-30: qty 10

## 2020-06-30 MED ORDER — OXYCODONE HCL 5 MG PO TABS
5.0000 mg | ORAL_TABLET | ORAL | Status: DC | PRN
  Administered 2020-06-30 – 2020-07-01 (×2): 10 mg via ORAL
  Filled 2020-06-30 (×2): qty 2

## 2020-06-30 MED ORDER — LIDOCAINE 2% (20 MG/ML) 5 ML SYRINGE
INTRAMUSCULAR | Status: DC | PRN
  Administered 2020-06-30: 60 mg via INTRAVENOUS

## 2020-06-30 MED ORDER — METOCLOPRAMIDE HCL 5 MG PO TABS
5.0000 mg | ORAL_TABLET | Freq: Three times a day (TID) | ORAL | Status: DC | PRN
  Filled 2020-06-30: qty 2

## 2020-06-30 MED ORDER — EPHEDRINE 5 MG/ML INJ
INTRAVENOUS | Status: AC
  Filled 2020-06-30: qty 10

## 2020-06-30 MED ORDER — METOCLOPRAMIDE HCL 5 MG/ML IJ SOLN
5.0000 mg | Freq: Three times a day (TID) | INTRAMUSCULAR | Status: DC | PRN
  Filled 2020-06-30: qty 2

## 2020-06-30 MED ORDER — FENTANYL CITRATE (PF) 100 MCG/2ML IJ SOLN: 25.0000 ug | INTRAMUSCULAR | Status: DC | PRN

## 2020-06-30 MED ORDER — ACETAMINOPHEN 500 MG PO TABS
ORAL_TABLET | ORAL | Status: AC
  Filled 2020-06-30: qty 2

## 2020-06-30 MED ORDER — SODIUM CHLORIDE 0.9 % IR SOLN
Status: DC | PRN
  Administered 2020-06-30: 3000 mL

## 2020-06-30 MED ORDER — HYDROMORPHONE HCL 1 MG/ML IJ SOLN
0.5000 mg | INTRAMUSCULAR | Status: DC | PRN
  Administered 2020-06-30: 1 mg via INTRAVENOUS
  Administered 2020-07-01: 0.5 mg via INTRAVENOUS
  Administered 2020-07-01: 1 mg via INTRAVENOUS
  Administered 2020-07-01 – 2020-07-02 (×2): 0.5 mg via INTRAVENOUS
  Filled 2020-06-30 (×5): qty 1

## 2020-06-30 MED ORDER — ROCURONIUM BROMIDE 10 MG/ML (PF) SYRINGE
PREFILLED_SYRINGE | INTRAVENOUS | Status: DC | PRN
  Administered 2020-06-30: 40 mg via INTRAVENOUS
  Administered 2020-06-30: 20 mg via INTRAVENOUS

## 2020-06-30 MED ORDER — ASPIRIN 81 MG PO CHEW
81.0000 mg | CHEWABLE_TABLET | Freq: Two times a day (BID) | ORAL | Status: DC
  Administered 2020-06-30 – 2020-07-02 (×4): 81 mg via ORAL
  Filled 2020-06-30 (×4): qty 1

## 2020-06-30 SURGICAL SUPPLY — 58 items
APL SKNCLS STERI-STRIP NONHPOA (GAUZE/BANDAGES/DRESSINGS)
BENZOIN TINCTURE PRP APPL 2/3 (GAUZE/BANDAGES/DRESSINGS) ×1 IMPLANT
BLADE CLIPPER SURG (BLADE) IMPLANT
BLADE SAW SGTL 18X1.27X75 (BLADE) ×2 IMPLANT
BLADE SAW SGTL 18X1.27X75MM (BLADE) ×1
CLOSURE WOUND 1/2 X4 (GAUZE/BANDAGES/DRESSINGS)
COVER SURGICAL LIGHT HANDLE (MISCELLANEOUS) ×3 IMPLANT
COVER WAND RF STERILE (DRAPES) ×3 IMPLANT
CUP SECTOR GRIPTON 50MM (Cup) ×2 IMPLANT
DRAPE C-ARM 42X72 X-RAY (DRAPES) ×3 IMPLANT
DRAPE STERI IOBAN 125X83 (DRAPES) ×3 IMPLANT
DRAPE U-SHAPE 47X51 STRL (DRAPES) ×7 IMPLANT
DRSG AQUACEL AG ADV 3.5X10 (GAUZE/BANDAGES/DRESSINGS) ×3 IMPLANT
DURAPREP 26ML APPLICATOR (WOUND CARE) ×3 IMPLANT
ELECT BLADE 4.0 EZ CLEAN MEGAD (MISCELLANEOUS) ×3
ELECT BLADE 6.5 EXT (BLADE) IMPLANT
ELECT REM PT RETURN 9FT ADLT (ELECTROSURGICAL) ×3
ELECTRODE BLDE 4.0 EZ CLN MEGD (MISCELLANEOUS) ×1 IMPLANT
ELECTRODE REM PT RTRN 9FT ADLT (ELECTROSURGICAL) ×1 IMPLANT
FACESHIELD WRAPAROUND (MASK) ×6 IMPLANT
FACESHIELD WRAPAROUND OR TEAM (MASK) ×2 IMPLANT
GAUZE XEROFORM 5X9 LF (GAUZE/BANDAGES/DRESSINGS) ×2 IMPLANT
GLOVE BIOGEL PI IND STRL 8 (GLOVE) ×2 IMPLANT
GLOVE BIOGEL PI INDICATOR 8 (GLOVE) ×4
GLOVE ECLIPSE 8.0 STRL XLNG CF (GLOVE) ×3 IMPLANT
GLOVE ORTHO TXT STRL SZ7.5 (GLOVE) ×6 IMPLANT
GOWN STRL REUS W/ TWL LRG LVL3 (GOWN DISPOSABLE) ×2 IMPLANT
GOWN STRL REUS W/ TWL XL LVL3 (GOWN DISPOSABLE) ×2 IMPLANT
GOWN STRL REUS W/TWL LRG LVL3 (GOWN DISPOSABLE) ×6
GOWN STRL REUS W/TWL XL LVL3 (GOWN DISPOSABLE) ×6
HANDPIECE INTERPULSE COAX TIP (DISPOSABLE) ×3
HEAD FEMORAL 32 CERAMIC (Hips) ×2 IMPLANT
KIT BASIN OR (CUSTOM PROCEDURE TRAY) ×3 IMPLANT
KIT TURNOVER KIT B (KITS) ×3 IMPLANT
LINER ACETABULAR 32X50 (Liner) ×2 IMPLANT
MANIFOLD NEPTUNE II (INSTRUMENTS) ×3 IMPLANT
NS IRRIG 1000ML POUR BTL (IV SOLUTION) ×3 IMPLANT
PACK TOTAL JOINT (CUSTOM PROCEDURE TRAY) ×3 IMPLANT
PAD ARMBOARD 7.5X6 YLW CONV (MISCELLANEOUS) ×3 IMPLANT
SCREW 6.5MMX25MM (Screw) ×2 IMPLANT
SET HNDPC FAN SPRY TIP SCT (DISPOSABLE) ×1 IMPLANT
STAPLER VISISTAT 35W (STAPLE) ×2 IMPLANT
STEM FEM ACTIS HIGH SZ2 (Stem) ×2 IMPLANT
STRIP CLOSURE SKIN 1/2X4 (GAUZE/BANDAGES/DRESSINGS) ×2 IMPLANT
SUT ETHIBOND NAB CT1 #1 30IN (SUTURE) ×3 IMPLANT
SUT MNCRL AB 4-0 PS2 18 (SUTURE) IMPLANT
SUT VIC AB 0 CT1 27 (SUTURE) ×3
SUT VIC AB 0 CT1 27XBRD ANBCTR (SUTURE) ×1 IMPLANT
SUT VIC AB 1 CT1 27 (SUTURE) ×3
SUT VIC AB 1 CT1 27XBRD ANBCTR (SUTURE) ×1 IMPLANT
SUT VIC AB 2-0 CT1 27 (SUTURE) ×3
SUT VIC AB 2-0 CT1 TAPERPNT 27 (SUTURE) ×1 IMPLANT
TOWEL GREEN STERILE (TOWEL DISPOSABLE) ×3 IMPLANT
TOWEL GREEN STERILE FF (TOWEL DISPOSABLE) ×3 IMPLANT
TRAY CATH 16FR W/PLASTIC CATH (SET/KITS/TRAYS/PACK) IMPLANT
TRAY FOLEY W/BAG SLVR 16FR (SET/KITS/TRAYS/PACK)
TRAY FOLEY W/BAG SLVR 16FR ST (SET/KITS/TRAYS/PACK) IMPLANT
WATER STERILE IRR 1000ML POUR (IV SOLUTION) ×6 IMPLANT

## 2020-06-30 NOTE — Anesthesia Postprocedure Evaluation (Signed)
Anesthesia Post Note  Patient: Justin Lam  Procedure(s) Performed: TOTAL HIP ARTHROPLASTY ANTERIOR APPROACH (Right Hip)     Patient location during evaluation: PACU Anesthesia Type: General Level of consciousness: awake and alert Pain management: pain level controlled Vital Signs Assessment: post-procedure vital signs reviewed and stable Respiratory status: spontaneous breathing, nonlabored ventilation, respiratory function stable and patient connected to nasal cannula oxygen Cardiovascular status: blood pressure returned to baseline and stable Postop Assessment: no apparent nausea or vomiting Anesthetic complications: no   No complications documented.  Last Vitals:  Vitals:   06/30/20 0935 06/30/20 0950  BP: 125/82 129/82  Pulse: 70 63  Resp: 12 15  Temp: 36.6 C 37.6 C  SpO2: 94% 96%    Last Pain:  Vitals:   06/30/20 0950  TempSrc: Oral  PainSc: 10-Worst pain ever                 Kennieth Rad

## 2020-06-30 NOTE — Progress Notes (Signed)
     Subjective: Day of Surgery Procedure(s) (LRB): TOTAL HIP ARTHROPLASTY ANTERIOR APPROACH (Right) Alert, oriented x 4. Wants to eat but bedside rolling table is not accessible with food on it, and the nurse call button and control is non functional. Able to move right leg now and pain is much better post surgery for right hip fracture.   Patient reports pain as mild.    Objective:   VITALS:  Temp:  [97.9 F (36.6 C)-99.7 F (37.6 C)] 99.7 F (37.6 C) (09/12 0950) Pulse Rate:  [60-89] 63 (09/12 0950) Resp:  [12-22] 15 (09/12 0950) BP: (113-148)/(75-91) 129/82 (09/12 0950) SpO2:  [91 %-99 %] 96 % (09/12 0950) Weight:  [61.2 kg] 61.2 kg (09/11 1619)  Neurologically intact ABD soft Neurovascular intact Sensation intact distally Intact pulses distally Dorsiflexion/Plantar flexion intact Incision: scant drainage No cellulitis present Compartment soft   LABS Recent Labs    06/29/20 1623 06/29/20 1628  HGB 13.9 13.4  WBC  --  7.8  PLT  --  227   Recent Labs    06/29/20 1623 06/29/20 1628  NA 142 141  K 5.4* 5.4*  CL 105 104  CO2  --  25  BUN 27* 19  CREATININE 0.90 0.97  GLUCOSE 101* 100*   No results for input(s): LABPT, INR in the last 72 hours.   Assessment/Plan: Day of Surgery Procedure(s) (LRB): TOTAL HIP ARTHROPLASTY ANTERIOR APPROACH (Right)  Advance diet Up with therapy D/C IV fluids Plan for discharge tomorrow  Vira Browns 06/30/2020, 1:42 PMPatient ID: Justin Lam, male   DOB: Jan 23, 1971, 49 y.o.   MRN: [id number]

## 2020-06-30 NOTE — Brief Op Note (Signed)
06/29/2020 - 06/30/2020  9:07 AM  PATIENT:  Justin Lam  49 y.o. male  PRE-OPERATIVE DIAGNOSIS:  CLOSED FRACTURE RIGHT HIP  POST-OPERATIVE DIAGNOSIS:  CLOSED RIGHT HIP FRACTURE  PROCEDURE:  Procedure(s): TOTAL HIP ARTHROPLASTY ANTERIOR APPROACH (Right)  SURGEON:  Surgeon(s) and Role:    * Kathryne Hitch, MD - Primary  PHYSICIAN ASSISTANT:  Rexene Edison, PA-C  ANESTHESIA:   general  EBL:  400 mL   COUNTS:  YES  DICTATION: .Other Dictation: Dictation Number [id number]  PLAN OF CARE: Admit to inpatient   PATIENT DISPOSITION:  PACU - hemodynamically stable.   Delay start of Pharmacological VTE agent (>24hrs) due to surgical blood loss or risk of bleeding: no

## 2020-06-30 NOTE — Anesthesia Procedure Notes (Signed)
Procedure Name: Intubation Date/Time: 06/30/2020 7:49 AM Performed by: Darletta Moll, CRNA Pre-anesthesia Checklist: Patient identified, Emergency Drugs available, Suction available and Patient being monitored Patient Re-evaluated:Patient Re-evaluated prior to induction Oxygen Delivery Method: Circle system utilized Preoxygenation: Pre-oxygenation with 100% oxygen Induction Type: IV induction Ventilation: Mask ventilation without difficulty, Two handed mask ventilation required and Oral airway inserted - appropriate to patient size Laryngoscope Size: Mac and 4 Grade View: Grade I Tube type: Oral Tube size: 7.5 mm Number of attempts: 1 Airway Equipment and Method: Stylet and Oral airway Placement Confirmation: ETT inserted through vocal cords under direct vision,  positive ETCO2 and breath sounds checked- equal and bilateral Secured at: 22 cm Tube secured with: Tape Dental Injury: Teeth and Oropharynx as per pre-operative assessment

## 2020-06-30 NOTE — Op Note (Signed)
NAME: DETRIC, SCALISI MEDICAL RECORD VP:[id number] ACCOUNT [id number] DATE OF BIRTH:[birth year] FACILITY: [facility] LOCATION: [facility] PHYSICIAN:Dannia Snook Aretha Parrot, MD  OPERATIVE REPORT  DATE OF PROCEDURE:  06/30/2020  PREOPERATIVE DIAGNOSIS:  Right hip with displaced femoral neck fracture from a high energy trauma.  POSTOPERATIVE DIAGNOSIS:  Right hip with displaced femoral neck fracture from a high energy trauma.  PROCEDURE:  Right total hip arthroplasty through direct anterior approach.  IMPLANTS:  DePuy Sector Gription acetabular component size 50, size 32+0 neutral polyethylene liner, size 2 Actis femoral component with high offset, size 32+1 ceramic hip ball.  SURGEON:  Vanita Panda.  Magnus Ivan, MD  ASSISTANT:  Richardean Canal, PA-C  ANESTHESIA:  General.  ANTIBIOTICS:  Two g IV Ancef.  ESTIMATED BLOOD LOSS:  500 mL.  COMPLICATIONS:  None.  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  He sustained a significant right femoral neck fracture with x-rays and CT scan showing comminution and displacement.  I  talked to him about options of open reduction and cannulated screw fixation versus a total hip arthroplasty.  My concern is the high energy nature of the trauma and the shear force across the femoral neck disrupting the hip capsule, which would certainly  increase his risk of going on to a nonunion with just cannulated screw fixation.  My other concern is he is a regular smoker and would be difficult to have him stop smoking and this could lead to poor healing as well.  I felt a better option was a total  hip arthroplasty in order to get him back mobilizing sooner and get him back to work.  He is a very thin individual.  I had a long and thorough discussion with him yesterday about the risks and benefits of both surgical options and we agreed to proceed  with a total hip arthroplasty  today.  DESCRIPTION OF PROCEDURE:  After informed consent was obtained, the appropriate right hip was marked.  He was brought to the operating room and general anesthesia was obtained while he was on a stretcher I placed traction boots on both his feet.  Next,  he was placed supine on the Hana fracture table with the perineal post in place and both legs in line skeletal traction device and no traction applied.  His right operative hip was prepped and draped with DuraPrep and sterile drapes.  A time-out was  called and he was identified as correct patient, correct right hip.  I then made an incision just inferior and posterior to the anterior superior iliac spine and carried this obliquely down the leg.  We dissected down to the tensor fascia lata muscle.   Tensor fascia was then divided longitudinally to proceed with direct anterior approach to the hip.  We identified and cauterized circumflex vessels and identified the hip capsule.  We opened up the hip capsule in an L-type format, finding a very large  hematoma and hemarthrosis within the hip capsule.  The fracture itself was significantly displaced and there was some comminution, so I felt good about our decision to proceed with a total hip arthroplasty.  We placed curved retractors around the medial  and lateral femoral neck and made our femoral neck cut distal to the fracture and proximal to the lesser trochanter with an oscillating saw.  We completed this with an osteotome.  I placed a corkscrew guide in the femoral head and removed the  femoral  head in its entirety.  We then removed bony fragments from around the acetabulum and removed the acetabular labrum and other debris.  We then began reaming under direct visualization from a size 43 reamer in stepwise increments going up to a size 50,  with all reamers under direct visualization, the last reamer under direct fluoroscopy so we could obtain our depth of reaming, our inclination and anteversion.   We then placed the real DePuy Sector Gription acetabular component size 50 and I did place a  single screw.  I then went with a 32+0 neutral polyethylene liner based on his offset and based on the fact that we did not have to significantly medialize and that cup filled the acetabulum.  Attention was then turned to the femur.  With the leg  externally rotated to 120 degrees, extended and adducted, we were able to place a Mueller retractor medially and a Hohman retractor behind the greater trochanter, released the lateral joint capsule and used a box-cutting osteotome to enter the femoral  canal and a rongeur to just lateralize a little.  We then started with the Actis broaching system after we used a box-cutting osteotome to enter femoral canal and then we meticulously went from a 0 to a size 2.  The patient has a narrow femoral canal and  a high quality bone, but he also has significant bowing.  It took some trying to get him in as best position as possible.  I wanted to go up to a size 3, but the size 3 would cause me to have to even lateralize more and it was significantly proud, so I  was concerned about leg length.  We trialed a size 2 femoral component with a high offset neck and a 32+1 hip ball and reduced this in the acetabulum and we were pleased with stability assessed mechanically and radiographically, as well as offset and leg  length.  I dislocated the hip and removed the trial components.  We went with the  high offset size 2 Actis femoral component.  We went with the real 32+1 ceramic hip ball, again reduced this in the acetabulum and we were pleased with stability.  We  then irrigated the soft tissue with normal saline solution using pulsatile lavage.  We closed the joint capsule with interrupted #1 Ethibond suture, followed by closing the tensor fascia with #1 Vicryl.  Zero Vicryl was used to close deep tissue and 2-0  Vicryl was used to close the subcutaneous tissue.  The skin was  reapproximated with staples.  Xeroform and Aquacel dressing was applied.  The patient was taken off the Hana table, awakened, extubated, and taken to recovery room in stable condition.  All  final counts were correct.  There were no complications noted.  Of note, Rexene Edison, PA-C did assist during the entire case.  His assistance was crucial for facilitating all aspects of this case.  VN/NUANCE  D:06/30/2020 T:06/30/2020 JOB:[id number]

## 2020-06-30 NOTE — Interval H&P Note (Signed)
History and Physical Interval Note:  The patient understands fully that we are proceeding to surgery today for a right total hip replacement to treat his displaced right hip femoral neck fracture.  The risks and benefits of surgery have been discussed in detail and informed consent is obtained.  There has been no interval change in his status.  06/30/2020 7:27 AM  Justin Lam  has presented today for surgery, with the diagnosis of CLOSED FRACTURE RIGHT HIP.  The various methods of treatment have been discussed with the patient and family. After consideration of risks, benefits and other options for treatment, the patient has consented to  Procedure(s): TOTAL HIP ARTHROPLASTY ANTERIOR APPROACH (Right) as a surgical intervention.  The patient's history has been reviewed, patient examined, no change in status, stable for surgery.  I have reviewed the patient's chart and labs.  Questions were answered to the patient's satisfaction.     Kathryne Hitch

## 2020-06-30 NOTE — Transfer of Care (Signed)
Immediate Anesthesia Transfer of Care Note  Patient: Justin Lam  Procedure(s) Performed: TOTAL HIP ARTHROPLASTY ANTERIOR APPROACH (Right Hip)  Patient Location: PACU  Anesthesia Type:General  Level of Consciousness: drowsy and patient cooperative  Airway & Oxygen Therapy: Patient Spontanous Breathing  Post-op Assessment: Report given to RN, Post -op Vital signs reviewed and stable and Patient moving all extremities X 4  Post vital signs: Reviewed and stable  Last Vitals:  Vitals Value Taken Time  BP 135/84 06/30/20 0919  Temp    Pulse 80 06/30/20 0920  Resp 21 06/30/20 0920  SpO2 97 % 06/30/20 0920  Vitals shown include unvalidated device data.  Last Pain:  Vitals:   06/30/20 0920  TempSrc:   PainSc: (P) 7       Patients Stated Pain Goal: 2 (06/30/20 0447)  Complications: No complications documented.

## 2020-06-30 NOTE — Anesthesia Preprocedure Evaluation (Signed)
Anesthesia Evaluation  Patient identified by MRN, date of birth, ID band Patient awake    Reviewed: Allergy & Precautions, NPO status , Patient's Chart, lab work & pertinent test results  Airway Mallampati: II  TM Distance: >3 FB Neck ROM: Full    Dental  (+) Dental Advisory Given   Pulmonary neg pulmonary ROS,    breath sounds clear to auscultation       Cardiovascular negative cardio ROS   Rhythm:Regular Rate:Normal     Neuro/Psych negative neurological ROS     GI/Hepatic negative GI ROS, Neg liver ROS,   Endo/Other  negative endocrine ROS  Renal/GU negative Renal ROS     Musculoskeletal   Abdominal   Peds  Hematology negative hematology ROS (+)   Anesthesia Other Findings   Reproductive/Obstetrics                             Lab Results  Component Value Date   WBC 7.8 06/29/2020   HGB 13.4 06/29/2020   HCT 42.8 06/29/2020   MCV 84.4 06/29/2020   PLT 227 06/29/2020    Anesthesia Physical Anesthesia Plan  ASA: I  Anesthesia Plan: General   Post-op Pain Management:    Induction: Intravenous  PONV Risk Score and Plan: 2 and Dexamethasone, Ondansetron and Treatment may vary due to age or medical condition  Airway Management Planned: Oral ETT  Additional Equipment: None  Intra-op Plan:   Post-operative Plan: Extubation in OR  Informed Consent: I have reviewed the patients History and Physical, chart, labs and discussed the procedure including the risks, benefits and alternatives for the proposed anesthesia with the patient or authorized representative who has indicated his/her understanding and acceptance.     Dental advisory given  Plan Discussed with: CRNA  Anesthesia Plan Comments:         Anesthesia Quick Evaluation

## 2020-06-30 NOTE — ED Provider Notes (Signed)
.  Critical Care Performed by: Terrilee Files, MD Authorized by: Terrilee Files, MD   Critical care provider statement:    Critical care time (minutes):  35   Critical care time was exclusive of:  Separately billable procedures and treating other patients   Critical care was necessary to treat or prevent imminent or life-threatening deterioration of the following conditions:  Trauma   Critical care was time spent personally by me on the following activities:  Discussions with consultants, evaluation of patient's response to treatment, examination of patient, ordering and performing treatments and interventions, ordering and review of laboratory studies, ordering and review of radiographic studies, pulse oximetry, re-evaluation of patient's condition, obtaining history from patient or surrogate, review of old charts and development of treatment plan with patient or surrogate   I assumed direction of critical care for this patient from another provider in my specialty: no        Terrilee Files, MD 06/30/20 1055

## 2020-06-30 NOTE — Progress Notes (Signed)
Orthopedic Tech Progress Note Patient Details:  Justin Lam Feb 10, 1971 [id number] Applied overhead trapeze to pt bed Patient ID: BAILEN GEFFRE, male   DOB: 1971/08/14, 49 y.o.   MRN: [id number]   Gerald Stabs 06/30/2020, 3:03 PM

## 2020-06-30 NOTE — Plan of Care (Signed)
Initial of Care Plan Problem: Education: Goal: Knowledge of General Education information will improve Description: Including pain rating scale, medication(s)/side effects and non-pharmacologic comfort measures Outcome: Progressing   Problem: Health Behavior/Discharge Planning: Goal: Ability to manage health-related needs will improve Outcome: Progressing   Problem: Clinical Measurements: Goal: Ability to maintain clinical measurements within normal limits will improve Outcome: Progressing Goal: Will remain free from infection Outcome: Progressing Goal: Diagnostic test results will improve Outcome: Progressing Goal: Respiratory complications will improve Outcome: Progressing Goal: Cardiovascular complication will be avoided Outcome: Progressing   Problem: Activity: Goal: Risk for activity intolerance will decrease Outcome: Progressing   Problem: Nutrition: Goal: Adequate nutrition will be maintained Outcome: Progressing   Problem: Coping: Goal: Level of anxiety will decrease Outcome: Progressing   Problem: Elimination: Goal: Will not experience complications related to bowel motility Outcome: Progressing Goal: Will not experience complications related to urinary retention Outcome: Progressing   Problem: Pain Managment: Goal: General experience of comfort will improve Outcome: Progressing   Problem: Safety: Goal: Ability to remain free from injury will improve Outcome: Progressing   Problem: Skin Integrity: Goal: Risk for impaired skin integrity will decrease Outcome: Progressing   Problem: Education: Goal: Verbalization of understanding the information provided (i.e., activity precautions, restrictions, etc) will improve Outcome: Progressing Goal: Individualized Educational Video(s) Outcome: Progressing   Problem: Activity: Goal: Ability to ambulate and perform ADLs will improve Outcome: Progressing   Problem: Clinical Measurements: Goal: Postoperative  complications will be avoided or minimized Outcome: Progressing   Problem: Self-Concept: Goal: Ability to maintain and perform role responsibilities to the fullest extent possible will improve Outcome: Progressing   Problem: Pain Management: Goal: Pain level will decrease Outcome: Progressing

## 2020-07-01 ENCOUNTER — Encounter (HOSPITAL_COMMUNITY): Payer: Self-pay | Admitting: Orthopaedic Surgery

## 2020-07-01 LAB — BASIC METABOLIC PANEL
Anion gap: 7 (ref 5–15)
BUN: 12 mg/dL (ref 6–20)
CO2: 26 mmol/L (ref 22–32)
Calcium: 8.2 mg/dL — ABNORMAL LOW (ref 8.9–10.3)
Chloride: 104 mmol/L (ref 98–111)
Creatinine, Ser: 0.86 mg/dL (ref 0.61–1.24)
GFR calc Af Amer: >60 mL/min (ref >60)
GFR calc non Af Amer: >60 mL/min (ref >60)
Glucose, Bld: 131 mg/dL — ABNORMAL HIGH (ref 70–99)
Potassium: 3.8 mmol/L (ref 3.5–5.1)
Sodium: 137 mmol/L (ref 135–145)

## 2020-07-01 LAB — CBC
HCT: 28.1 % — ABNORMAL LOW (ref 39.0–52.0)
Hemoglobin: 8.9 g/dL — ABNORMAL LOW (ref 13.0–17.0)
MCH: 26.4 pg (ref 26.0–34.0)
MCHC: 31.7 g/dL (ref 30.0–36.0)
MCV: 83.4 fL (ref 80.0–100.0)
Platelets: 142 10*3/uL — ABNORMAL LOW (ref 150–400)
RBC: 3.37 MIL/uL — ABNORMAL LOW (ref 4.22–5.81)
RDW: 13.5 % (ref 11.5–15.5)
WBC: 13.9 10*3/uL — ABNORMAL HIGH (ref 4.0–10.5)
nRBC: 0 % (ref 0.0–0.2)

## 2020-07-01 MED ORDER — PNEUMOCOCCAL VAC POLYVALENT 25 MCG/0.5ML IJ INJ
0.5000 mL | INJECTION | INTRAMUSCULAR | Status: AC
  Administered 2020-07-02: 0.5 mL via INTRAMUSCULAR
  Filled 2020-07-01: qty 0.5

## 2020-07-01 MED ORDER — INFLUENZA VAC SPLIT QUAD 0.5 ML IM SUSY
0.5000 mL | PREFILLED_SYRINGE | INTRAMUSCULAR | Status: AC
  Administered 2020-07-02: 0.5 mL via INTRAMUSCULAR
  Filled 2020-07-01: qty 0.5

## 2020-07-01 NOTE — TOC Initial Note (Addendum)
Transition of Care [facility]) - Initial/Assessment Note    Patient Details  Name: Justin Lam MRN: [id number] Date of Birth: 1971/05/08  [facility] CM/SW Contact:    Kingsley Plan, RN Phone Number: 07/01/2020, 12:50 PM  Clinical Narrative:                  Confirmed face sheet information with patient at bedside.   PAtient from home with SGO Justin Lam.   Patient fell at work . Patient works for [employer], employer Justin Lam [phone].PAtient consented for NCM to call Justin Lam. Spoke to [employer]. 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 Justin Lam did offer to drive patient home at discharge.     Patient aware . Patient does not have any insurance.   Called Melissa with [facility] , she will review chart and see if he qualifies for charity HHPT and if they can accept referral. Await call back.  Update Melissa with [facility] called back, patient has been approved for 3 HHPT charity visits.  Called Velna Hatchet with [facility] and ordered walker and 3 in1. SHe will also see if patient qualifies for charity DME.   Patient aware of all of above.     Patient Goals and CMS Choice     Choice offered to / list presented to : Patient  Expected Discharge Plan and Services       Post Acute Care Choice: Durable Medical Equipment, Home Health                     DME Agency: [facility]                  Prior Living Arrangements/Services     Patient language and need for interpreter reviewed:: Yes        Need for Family Participation in Patient Care: Yes (Comment) Care giver support system in place?: Yes (comment)   Criminal Activity/Legal Involvement Pertinent to Current Situation/Hospitalization: No - Comment as needed  Activities of Daily Living Home Assistive Devices/Equipment: Cane (specify quad or straight) ADL Screening (condition at time of admission) Patient's cognitive ability adequate to safely complete daily activities?:  Yes Is the patient deaf or have difficulty hearing?: No Does the patient have difficulty seeing, even when wearing glasses/contacts?: No Does the patient have difficulty concentrating, remembering, or making decisions?: No Patient able to express need for assistance with ADLs?: Yes Does the patient have difficulty dressing or bathing?: No Independently performs ADLs?: Yes (appropriate for developmental age) Does the patient have difficulty walking or climbing stairs?: Yes Weakness of Legs: Right Weakness of Arms/Hands: None  Permission Sought/Granted   Permission granted to share information with : Yes, Verbal Permission Granted  Share Information with NAME: [employer] Justin Lam [phone]           Emotional Assessment Appearance:: Appears stated age Attitude/Demeanor/Rapport: Engaged Affect (typically observed): Accepting Orientation: : Oriented to Self, Oriented to Place, Oriented to  Time, Oriented to Situation Alcohol / Substance Use: Not Applicable Psych Involvement: No (comment)  Admission diagnosis:  Closed right hip fracture (HCC) [S72.001A] Closed fracture of right hip, initial encounter (HCC) [S72.001A] Fall from roof, initial encounter [W13.2XXA] Patient Active Problem List   Diagnosis Date Noted  . Closed displaced fracture of neck of right femur (HCC) 06/29/2020  . Closed right hip fracture (HCC) 06/29/2020   PCP:  Patient, No Pcp Per Pharmacy:   [facility] [phone] - [place], [place] -  [address] [place] [address] Phone: [phone] Fax: [phone]     Social Determinants of Health (SDOH) Interventions    Readmission Risk Interventions No flowsheet data found.

## 2020-07-01 NOTE — Social Work (Addendum)
CSW acknowledging consult for HH/DME.  Will follow for therapy recommendations needed to best determine disposition, pt currently uninsured. Once recs made charity care assessment will need to be made vs workers comp coverage.   Octavio Graves, MSW, LCSW [facility] Clinical Social Work

## 2020-07-01 NOTE — Progress Notes (Signed)
Patient ID: Justin Lam, male   DOB: 1971-04-08, 49 y.o.   MRN: [id number] The patient tolerated surgery well yesterday.  He has not been up with therapy yet.  Hopefully PT will get him up today and we can see how he does from a mobility standpoint.  If things go well, he may be able to be discharged to home later today versus tomorrow.  His vital signs are stable.  He denies lightheadedness.  He does have acute blood loss anemia with a hemoglobin of below 9.  His right hip is stable and his leg lengths are equal.  His incision has just some scant bloody drainage.  I will check on him later today.

## 2020-07-01 NOTE — Plan of Care (Signed)

## 2020-07-01 NOTE — Progress Notes (Signed)
Patient ID: Justin Lam, male   DOB: 1971-07-27, 49 y.o.   MRN: [id number] The patient has still not been up with therapy.  Once he is allowed to mobilize with weightbearing as tolerated on his right hip, we can better determine his ability to be able to be discharged safely to home.  Therapy will be able to help determine his equipment needs as well.

## 2020-07-01 NOTE — Discharge Instructions (Signed)

## 2020-07-01 NOTE — Evaluation (Signed)
Physical Therapy Evaluation Patient Details Name: Justin Lam MRN: [id number] DOB: 1971-05-13 Today's Date: 07/01/2020   History of Present Illness  49 yo male who has fallen 21' at work to sustain a R femoral fracture received R THA, has WBAT permission with direct anterior approach.  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 renal calculi  Clinical Impression  Pt is demonstrating difficulty moving R hip and thigh, requires some assistance to reposition and to move for mobility.  Followed up with pt having some ROM to RLE very minimally and then positioned on chair to be comfortable.  Follow alone with PTacutely to increase control of standing balance and gait quality esp on stairs with crutches.    Follow Up Recommendations      Equipment Recommendations  None recommended by PT    Recommendations for Other Services       Precautions / Restrictions Precautions Precautions: Fall Precaution Comments: impulsive Restrictions Weight Bearing Restrictions: Yes Other Position/Activity Restrictions: WBAT RLE      Mobility  Bed Mobility Overal bed mobility: Needs Assistance Bed Mobility: Supine to Sit;Sit to Supine     Supine to sit: Min guard;Min assist Sit to supine: Min assist      Transfers Overall transfer level: Needs assistance Equipment used: Rolling walker (2 wheeled);1 person hand held assist Transfers: Sit to/from Stand Sit to Stand: Min assist            Ambulation/Gait Ambulation/Gait assistance: Min assist Gait Distance (Feet): 140 Feet (30+110) Assistive device: Rolling walker (2 wheeled);1 person hand held assist Gait Pattern/deviations: Step-through pattern;Decreased stance time - right;Wide base of support Gait velocity: reduced Gait velocity interpretation: <1.31 ft/sec, indicative of household ambulator General Gait Details: started with RW then worked on crutches  Risk manager    Modified Rankin (Stroke Patients Only)       Balance Overall balance assessment: Needs assistance Sitting-balance support: Feet supported Sitting balance-Leahy Scale: Fair   Postural control: Posterior lean Standing balance support: Bilateral upper extremity supported;During functional activity Standing balance-Leahy Scale: Fair                               Pertinent Vitals/Pain Pain Assessment: Faces Faces Pain Scale: Hurts even more Pain Location: R hip Pain Descriptors / Indicators: Operative site guarding;Grimacing;Guarding Pain Intervention(s): Monitored during session;Limited activity within patient's tolerance;Premedicated before session;Repositioned    Home Living Family/patient expects to be discharged to:: Private residence Living Arrangements: Spouse/significant other Available Help at Discharge: Family;Available 24 hours/day Type of Home: House Home Access: Stairs to enter Entrance Stairs-Rails: Lawyer of Steps: 18 Home Layout: One level Home Equipment: None;Other (comment) (wife is disabled)      Prior Function Level of Independence: Independent         Comments: working as Designer, fashion/clothing for 35 years     Hand Dominance   Dominant Hand: Right    Extremity/Trunk Assessment        Lower Extremity Assessment Lower Extremity Assessment: RLE deficits/detail RLE Deficits / Details: pain to flex hip and move to abduct RLE: Unable to fully assess due to pain RLE Coordination: decreased gross motor    Cervical / Trunk Assessment Cervical / Trunk Assessment: Other exceptions Cervical / Trunk Exceptions: DDD cervical spine  Communication   Communication: No difficulties  Cognition Arousal/Alertness: Awake/alert Behavior During Therapy: Anxious  Overall Cognitive Status: Within Functional Limits for tasks assessed                                 General Comments: pt is demonstrating  difficulty with initiating movement on R hip, better with assist to start moving      General Comments General comments (skin integrity, edema, etc.): pt is demonstrating better control of balance with standing on crutches with cues for tripod standing gait      Exercises     Assessment/Plan    PT Assessment Patient needs continued PT services  PT Problem List Decreased strength;Decreased range of motion;Decreased activity tolerance;Decreased balance;Decreased mobility;Decreased coordination;Decreased cognition;Decreased knowledge of use of DME;Decreased safety awareness;Decreased knowledge of precautions;Cardiopulmonary status limiting activity       PT Treatment Interventions DME instruction;Gait training;Stair training;Functional mobility training;Therapeutic activities;Therapeutic exercise;Neuromuscular re-education;Balance training;Patient/family education    PT Goals (Current goals can be found in the Care Plan section)       Frequency Min 6X/week   Barriers to discharge Decreased caregiver support pt has disabled wife to whom he has been caregi er    Co-evaluation               AM-PAC PT "6 Clicks" Mobility  Outcome Measure Help needed turning from your back to your side while in a flat bed without using bedrails?: A Little Help needed moving from lying on your back to sitting on the side of a flat bed without using bedrails?: A Lot Help needed moving to and from a bed to a chair (including a wheelchair)?: A Lot Help needed standing up from a chair using your arms (e.g., wheelchair or bedside chair)?: A Lot Help needed to walk in hospital room?: A Lot Help needed climbing 3-5 steps with a railing? : A Lot 6 Click Score: 13    End of Session Equipment Utilized During Treatment: Gait belt Activity Tolerance: Patient tolerated treatment well Patient left: in chair;with call bell/phone within reach Nurse Communication: Mobility status PT Visit Diagnosis:  Unsteadiness on feet (R26.81);Other abnormalities of gait and mobility (R26.89);Repeated falls (R29.6)    Time: 4967-5916 PT Time Calculation (min) (ACUTE ONLY): 39 min   Charges:   PT Evaluation $PT Eval Moderate Complexity: 1 Mod PT Treatments $Gait Training: 8-22 mins $Therapeutic Activity: 8-22 mins       Ivar Drape 07/01/2020, 9:40 PM  Samul Dada, PT MS Acute Rehab Dept. Number: [facility] [id number] and [facility] [phone]

## 2020-07-01 NOTE — TOC CAGE-AID Note (Signed)
Transition of Care [facility]) - CAGE-AID Screening   Patient Details  Name: Justin Lam MRN: [id number] Date of Birth: 25-Nov-1970  Transition of Care [facility]) CM/SW Contact:    Emeterio Reeve, [place] Phone Number: 07/01/2020, 4:38 PM   Clinical Narrative: CSW met with pt at bedside. CSW introduced self and explained her role at the hospital.  PT denies alcohol use. Pt reports occasional marijuana [place]. Pt denied SA resources.   Pt inquired about community resources to help with fiances. CSW will give pt general resources packet.     CAGE-AID Screening:    Have You Ever Felt You Ought to Cut Down on Your Drinking or Drug Use?: No Have People Annoyed You By Critizing Your Drinking Or Drug Use?: No Have You Felt Bad Or Guilty About Your Drinking Or Drug Use?: No Have You Ever Had a Drink or Used Drugs First Thing In The Morning to Steady Your Nerves or to Get Rid of a Hangover?: No CAGE-AID Score: 0  Substance Abuse Education Offered: Yes      Blima Ledger, [place] Social Worker [phone]

## 2020-07-01 NOTE — Progress Notes (Signed)
Physical Therapy Treatment Patient Details Name: Justin Lam MRN: [id number] DOB: 1970-10-29 Today's Date: 07/01/2020    History of Present Illness 49 yo male who has fallen 70' at work to sustain a R femoral fracture received R THA, has WBAT permission with direct anterior approach.  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 renal calculi    PT Comments    Pt is unable to tolerate OOB but did feel better after extensive time reviewing an performing his exercises.   Pt is progressing toward going home.  Requires to climb stairs to manage there, and will practice with stairs tomorrow.  Follow acutely for these needs, to increase balance and standing toward gait.  Follow Up Recommendations   HHPT     Equipment Recommendations  None recommended by PT    Recommendations for Other Services       Precautions / Restrictions Precautions Precautions: Fall Precaution Comments: impulsive Restrictions Weight Bearing Restrictions: Yes Other Position/Activity Restrictions: WBAT RLE    Mobility  Bed Mobility Overal bed mobility: Needs Assistance Bed Mobility: Supine to Sit;Sit to Supine     Supine to sit: Min guard;Min assist Sit to supine: Min assist      Transfers Overall transfer level: Needs assistance Equipment used: Rolling walker (2 wheeled);1 person hand held assist Transfers: Sit to/from Stand Sit to Stand: Min assist         General transfer comment: delined to get OOB  Ambulation/Gait Ambulation/Gait assistance: Min assist Gait Distance (Feet): 140 Feet (30+110) Assistive device: Rolling walker (2 wheeled);1 person hand held assist Gait Pattern/deviations: Step-through pattern;Decreased stance time - right;Wide base of support Gait velocity: reduced Gait velocity interpretation: <1.31 ft/sec, indicative of household ambulator General Gait Details: started with RW then worked on crutches   Acupuncturist    Modified Rankin (Stroke Patients Only)       Balance Overall balance assessment: Needs assistance Sitting-balance support: Feet supported Sitting balance-Leahy Scale: Fair   Postural control: Posterior lean Standing balance support: Bilateral upper extremity supported;During functional activity Standing balance-Leahy Scale: Fair                              Cognition Arousal/Alertness: Awake/alert Behavior During Therapy: Anxious Overall Cognitive Status: Within Functional Limits for tasks assessed                                 General Comments: pt is demonstrating difficulty with initiating movement on R hip, better with assist to start moving      Exercises General Exercises - Lower Extremity Ankle Circles/Pumps: AROM Quad Sets: AAROM;AROM Gluteal Sets: AROM;Both Heel Slides: AAROM;10 reps Hip ABduction/ADduction: AAROM;10 reps    General Comments General comments (skin integrity, edema, etc.): ROM to BLE's with pt not being OOB all afternoon.      Pertinent Vitals/Pain Pain Assessment: Faces Faces Pain Scale: Hurts even more Pain Location: R hip Pain Descriptors / Indicators: Operative site guarding;Grimacing;Guarding Pain Intervention(s): Monitored during session;Limited activity within patient's tolerance;Premedicated before session;Repositioned    Home Living Family/patient expects to be discharged to:: Private residence Living Arrangements: Spouse/significant other Available Help at Discharge: Family;Available 24 hours/day Type of Home: House Home Access: Stairs to enter Entrance Stairs-Rails: Left;Right Home Layout: One level Home Equipment: None;Other (comment) (wife is  disabled)      Prior Function Level of Independence: Independent      Comments: working as Designer, fashion/clothing for 35 years   PT Goals (current goals can now be found in the care plan section)      Frequency    Min 6X/week       PT Plan      Co-evaluation              AM-PAC PT "6 Clicks" Mobility   Outcome Measure  Help needed turning from your back to your side while in a flat bed without using bedrails?: A Little Help needed moving from lying on your back to sitting on the side of a flat bed without using bedrails?: A Lot Help needed moving to and from a bed to a chair (including a wheelchair)?: A Lot Help needed standing up from a chair using your arms (e.g., wheelchair or bedside chair)?: A Lot Help needed to walk in hospital room?: A Lot Help needed climbing 3-5 steps with a railing? : A Lot 6 Click Score: 13    End of Session Equipment Utilized During Treatment: Gait belt Activity Tolerance: Patient tolerated treatment well Patient left: in chair;with call bell/phone within reach Nurse Communication: Mobility status PT Visit Diagnosis: Unsteadiness on feet (R26.81);Other abnormalities of gait and mobility (R26.89);Repeated falls (R29.6)     Time: 7564-3329 PT Time Calculation (min) (ACUTE ONLY): 31 min  Charges:  $Gait Training: 8-22 mins $Therapeutic Exercise: 23-37 mins $Therapeutic Activity: 8-22 mins                  Ivar Drape 07/01/2020, 9:47 PM  Samul Dada, PT MS Acute Rehab Dept. Number: [facility] [id number] and [facility] [phone]

## 2020-07-02 ENCOUNTER — Encounter (HOSPITAL_COMMUNITY): Payer: Self-pay | Admitting: Orthopaedic Surgery

## 2020-07-02 LAB — CBC
HCT: 29.7 % — ABNORMAL LOW (ref 39.0–52.0)
Hemoglobin: 9.7 g/dL — ABNORMAL LOW (ref 13.0–17.0)
MCH: 27.1 pg (ref 26.0–34.0)
MCHC: 32.7 g/dL (ref 30.0–36.0)
MCV: 83 fL (ref 80.0–100.0)
Platelets: 158 10*3/uL (ref 150–400)
RBC: 3.58 MIL/uL — ABNORMAL LOW (ref 4.22–5.81)
RDW: 13.3 % (ref 11.5–15.5)
WBC: 12.1 10*3/uL — ABNORMAL HIGH (ref 4.0–10.5)
nRBC: 0 % (ref 0.0–0.2)

## 2020-07-02 MED ORDER — OXYCODONE HCL 5 MG PO TABS: 5.0000 mg | ORAL_TABLET | ORAL | 0 refills | Status: DC | PRN

## 2020-07-02 MED ORDER — METHOCARBAMOL 500 MG PO TABS: 500.0000 mg | ORAL_TABLET | Freq: Four times a day (QID) | ORAL | 1 refills | Status: AC | PRN

## 2020-07-02 MED ORDER — ASPIRIN 81 MG PO CHEW: 81.0000 mg | CHEWABLE_TABLET | Freq: Two times a day (BID) | ORAL | 0 refills | Status: AC

## 2020-07-02 NOTE — Progress Notes (Signed)
Patient ID: Justin Lam, male   DOB: Mar 11, 1971, 49 y.o.   MRN: [id number] Doing well overall.  Right hip stable.  Vitals and labs stable.  Has worked with therapy.  Can be discharged to home today.

## 2020-07-02 NOTE — Progress Notes (Addendum)
Physical Therapy Treatment Patient Details Name: Justin Lam MRN: [id number] DOB: 1970-10-27 Today's Date: 07/02/2020    History of Present Illness 49 yo male who has fallen 53' at work to sustain a R femoral fracture received R THA, has WBAT permission with direct anterior approach.  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 renal calculi    PT Comments    Pt supine in bed on arrival this session.  Pt required supervision to min assistance for functional mobility.  Plan for return home today with limited support.  He will require supervision for OOB mobility.  Pt mildly unsteady with crutches but prefers them after using during yesterday's session.  Issued HEP and reviewed stair negotiation.  He will likely not qualify but would benefit from HHPT services at d/c.     Follow Up Recommendations  Home health PT;Supervision for mobility/OOB     Equipment Recommendations  Crutches    Recommendations for Other Services       Precautions / Restrictions Precautions Precautions: Fall Precaution Comments: impulsive Restrictions Weight Bearing Restrictions: No Other Position/Activity Restrictions: WBAT RLE    Mobility  Bed Mobility Overal bed mobility: Needs Assistance Bed Mobility: Supine to Sit     Supine to sit: Supervision     General bed mobility comments: Supervision with use of gt belt to support limb to edge of bed. Heavy reliance on rails but capable to come to sitting without use of rails.  Transfers Overall transfer level: Needs assistance Equipment used: Crutches Transfers: Sit to/from Stand Sit to Stand: Supervision         General transfer comment: Cues for hand placement to use crutches safely.  Ambulation/Gait Ambulation/Gait assistance: Min guard Gait Distance (Feet): 10 Feet (+ 110 ft.) Assistive device: Crutches Gait Pattern/deviations: Step-through pattern;Decreased stance time - right;Antalgic;Decreased  dorsiflexion - right Gait velocity: reduced   General Gait Details: Mild LOB when slowing gt sequencing to correct pattern.  Pt with decreased heel strike and required cues for heel strike into foot flat during session.   Stairs Stairs: Yes Stairs assistance: Min guard;Min assist Stair Management: One rail Right;One rail Left;Forwards;With crutches Number of Stairs: 12 General stair comments: R rail to ascend and L rail to descend.  Cues for sequencing and crutch placement on stairs.   Wheelchair Mobility    Modified Rankin (Stroke Patients Only)       Balance Overall balance assessment: Needs assistance Sitting-balance support: Feet supported Sitting balance-Leahy Scale: Fair   Postural control: Posterior lean Standing balance support: Bilateral upper extremity supported;During functional activity Standing balance-Leahy Scale: Fair                              Cognition Arousal/Alertness: Awake/alert Behavior During Therapy: WFL for tasks assessed/performed Overall Cognitive Status: Within Functional Limits for tasks assessed                                        Exercises Total Joint Exercises Ankle Circles/Pumps: AROM;Both;20 reps;Supine Quad Sets: AROM;Right;10 reps;Supine Short Arc Quad: AROM;Right;10 reps;Supine Heel Slides: AAROM;Right;10 reps;Supine Hip ABduction/ADduction: AAROM;Right;10 reps;Supine Knee Flexion: AROM;Right;5 reps;Standing Marching in Standing: AROM;Right;5 reps;Standing Standing Hip Extension: AROM;Right;5 reps;Standing General Exercises - Lower Extremity Hip ABduction/ADduction: AROM;Right;5 reps;Standing    General Comments        Pertinent Vitals/Pain Pain Assessment: Faces Faces  Pain Scale: Hurts even more Pain Location: R hip Pain Descriptors / Indicators: Operative site guarding;Grimacing;Guarding Pain Intervention(s): Monitored during session;Repositioned;Ice applied    Home Living                       Prior Function            PT Goals (current goals can now be found in the care plan section) Progress towards PT goals: Progressing toward goals    Frequency    Min 5X/week      PT Plan Frequency needs to be updated;Discharge plan needs to be updated    Co-evaluation              AM-PAC PT "6 Clicks" Mobility   Outcome Measure  Help needed turning from your back to your side while in a flat bed without using bedrails?: None Help needed moving from lying on your back to sitting on the side of a flat bed without using bedrails?: None Help needed moving to and from a bed to a chair (including a wheelchair)?: None   Help needed to walk in hospital room?: A Little Help needed climbing 3-5 steps with a railing? : A Little 6 Click Score: 18    End of Session Equipment Utilized During Treatment: Gait belt Activity Tolerance: Patient tolerated treatment well Patient left: in chair;with call bell/phone within reach;with chair alarm set Nurse Communication: Mobility status PT Visit Diagnosis: Unsteadiness on feet (R26.81);Other abnormalities of gait and mobility (R26.89);Repeated falls (R29.6)     Time: 1000-1043 PT Time Calculation (min) (ACUTE ONLY): 43 min  Charges:  $Gait Training: 23-37 mins $Therapeutic Exercise: 8-22 mins                     [place] R. , PTA Acute Rehabilitation Services Pager [phone] Office [phone]     Trebor Galdamez Artis Delay 07/02/2020, 11:02 AM

## 2020-07-02 NOTE — Discharge Summary (Signed)
Patient ID: Justin Lam MRN: [id number] DOB/AGE: 50/10/1970 50 y.o.  Admit date: 06/29/2020 Discharge date: 07/02/2020  Admission Diagnoses:  Principal Problem:   Closed displaced fracture of neck of right femur [facility](HCC) Active Problems:   Closed right hip fracture [facility](HCC)   Discharge Diagnoses:  Same  Past Medical History:  Diagnosis Date  . Hepatitis    history of hep c per patient    Surgeries: Procedure(s): TOTAL HIP ARTHROPLASTY ANTERIOR APPROACH on 06/30/2020   Consultants: Treatment Team:  Kathryne HitchBlackman, Aliyana Dlugosz Y, MD  Discharged Condition: Improved  Hospital Course: Justin Lam is an 49 y.o. male who was admitted 06/29/2020 for operative treatment ofClosed displaced fracture of neck of right femur (HCC). Patient has severe unremitting pain that affects sleep, daily activities, and work/hobbies. After pre-op clearance the patient was taken to the operating room on 06/30/2020 and underwent  Procedure(s): TOTAL HIP ARTHROPLASTY ANTERIOR APPROACH.    Patient was given perioperative antibiotics:  Anti-infectives (From admission, onward)   Start     Dose/Rate Route Frequency Ordered Stop   06/30/20 0930  ceFAZolin (ANCEF) IVPB 1 g/50 mL premix        1 g 100 mL/hr over 30 Minutes Intravenous Every 6 hours 06/30/20 0915 06/30/20 2129       Patient was given sequential compression devices, early ambulation, and chemoprophylaxis to prevent DVT.  Patient benefited maximally from hospital stay and there were no complications.    Recent vital signs:  Patient Vitals for the past 24 hrs:  BP Temp Temp src Pulse Resp SpO2  07/02/20 0625 -- 98.7 F (37.1 C) Oral -- -- --  07/02/20 0550 119/80 -- -- 96 18 96 %  07/01/20 2331 109/69 (!) 101.2 F (38.4 C) Oral 99 19 96 %  07/01/20 1356 122/71 (!) 100.5 F (38.1 C) Oral 96 17 98 %  07/01/20 1025 115/70 100.2 F (37.9 C) Oral 88 18 97 %     Recent laboratory studies:  Recent Labs    06/29/20 1628  06/29/20 1628 07/01/20 0128 07/02/20 0436  WBC 7.8   < > 13.9* 12.1*  HGB 13.4   < > 8.9* 9.7*  HCT 42.8   < > 28.1* 29.7*  PLT 227   < > 142* 158  NA 141  --  137  --   K 5.4*  --  3.8  --   CL 104  --  104  --   CO2 25  --  26  --   BUN 19  --  12  --   CREATININE 0.97  --  0.86  --   GLUCOSE 100*  --  131*  --   CALCIUM 9.1   < > 8.2*  --    < > = values in this interval not displayed.     Discharge Medications:   Allergies as of 07/02/2020   No Known Allergies     Medication List    TAKE these medications   aspirin 81 MG chewable tablet Chew 1 tablet (81 mg total) by mouth 2 (two) times daily.   methocarbamol 500 MG tablet Commonly known as: ROBAXIN Take 1 tablet (500 mg total) by mouth every 6 (six) hours as needed for muscle spasms.   oxyCODONE 5 MG immediate release tablet Commonly known as: Oxy IR/ROXICODONE Take 1-2 tablets (5-10 mg total) by mouth every 4 (four) hours as needed for moderate pain (pain score 4-6).  Durable Medical Equipment  (From admission, onward)         Start     Ordered   06/30/20 0956  DME Walker rolling  Once       Question Answer Comment  Walker: With 5 Inch Wheels   Patient needs a walker to treat with the following condition Status post total replacement of right hip      06/30/20 0955   06/30/20 0956  DME 3 n 1  Once        06/30/20 0955          Diagnostic Studies: CT HEAD WO CONTRAST  Result Date: 06/29/2020 CLINICAL DATA:  Head trauma, fell from roof, negative past medical history EXAM: CT HEAD WITHOUT CONTRAST CT CERVICAL SPINE WITHOUT CONTRAST TECHNIQUE: Multidetector CT imaging of the head and cervical spine was performed following the standard protocol without intravenous contrast. Multiplanar CT image reconstructions of the cervical spine were also generated. COMPARISON:  None FINDINGS: CT HEAD FINDINGS Brain: Normal ventricular morphology. No midline shift or mass effect. Normal appearance of brain  parenchyma. No intracranial hemorrhage, mass lesion, evidence of acute infarction, or extra-axial fluid collection. Vascular: No hyperdense vessels Skull: Intact Sinuses/Orbits: Scattered mucosal thickening in ethmoid air cells. Remaining paranasal sinuses and mastoid air cells clear Other: N/A CT CERVICAL SPINE FINDINGS Alignment: Retrolisthesis at C5-C6, approximately 3 mm. Remaining alignments normal. Skull base and vertebrae: Disc space narrowing C5-C6 and C4-C5, with small associated endplate spurs. Vertebral body and remaining disc space heights maintained. Skull base intact. No fracture, additional subluxation, or bone destruction. Soft tissues and spinal canal: Prevertebral soft tissues normal thickness. 5 mm LEFT thyroid nodule; not clinically significant; no follow-up imaging recommended (ref: J Am Coll Radiol. 2015 Feb;12(2): 143-50).Remaining soft tissues unremarkable Disc levels:  No specific abnormalities Upper chest: Lung apices clear Other: N/A IMPRESSION: No acute intracranial abnormalities. Degenerative disc disease changes at C5-C6 and C4-C5 with mild retrolisthesis at C5-C6. No acute cervical spine abnormalities. Electronically Signed   By: Ulyses Southward M.D.   On: 06/29/2020 17:20   CT CHEST W CONTRAST  Result Date: 06/29/2020 CLINICAL DATA:  Larey Seat from a roof. EXAM: CT CHEST, ABDOMEN, AND PELVIS WITH CONTRAST TECHNIQUE: Multidetector CT imaging of the chest, abdomen and pelvis was performed following the standard protocol during bolus administration of intravenous contrast. CONTRAST:  OMNIPAQUE IOHEXOL 300 MG/ML  SOLN COMPARISON:  None. FINDINGS: CT CHEST FINDINGS Cardiovascular: The heart is normal in size. No pericardial effusion. The aorta is normal in caliber. No dissection. The branch vessels are patent. Few scattered coronary artery calcifications are noted. The pulmonary arteries appear normal. Mediastinum/Nodes: No mediastinal or hilar mass or adenopathy or hematoma. The  esophagus is grossly normal. Lungs/Pleura: Streaky dependent bibasilar subpleural atelectasis but no pulmonary contusion, pneumothorax or pleural effusion. Musculoskeletal: The bony thorax is intact. No sternal, rib or thoracic vertebral body fractures. CT ABDOMEN PELVIS FINDINGS Hepatobiliary: No acute hepatic injury or perihepatic fluid collections. The bladder is surgically absent. No intra or extrahepatic biliary dilatation. Pancreas: No mass, inflammation or ductal dilatation. No acute pancreatic injury or peripancreatic fluid collection. Incidental pancreatic divisum. Spleen: Normal size. No acute splenic injury or perisplenic fluid collection. Adrenals/Urinary Tract: The adrenal glands and kidneys are unremarkable. Small renal calculi noted. No acute renal injury or perirenal fluid collection. The bladder is unremarkable. Stomach/Bowel: The stomach, duodenum, small bowel and colon are unremarkable. No evidence of acute bowel injury. No free fluid or free air. Vascular/Lymphatic: The aorta and branch  vessels are patent. The major venous structures are patent. No mesenteric or retroperitoneal mass, adenopathy or hematoma. Reproductive: The prostate gland and seminal vesicles are unremarkable. Other: There is a mixed fatty lesion projecting off the left buttock region. This may be some type of hernia or exophytic fatty lesion. It should be obvious clinically. Few surgical clips are noted in the perirectal space of uncertain etiology. Musculoskeletal: There is a displaced fracture of the right femoral neck. The pubic symphysis and SI joints are intact. No pelvic fractures. The lumbar vertebral bodies are normally aligned. No lumbar spine fractures. IMPRESSION: 1. Displaced right femoral neck fracture. 2. No other significant findings in the chest, abdomen or pelvis. 3. Mixed fatty lesion projecting off the left buttock region. This may be some type of hernia or exophytic fatty lesion. It should be obvious  clinically. 4. Status post cholecystectomy. No biliary dilatation. 5. Small renal calculi. Electronically Signed   By: Rudie Meyer M.D.   On: 06/29/2020 17:22   CT CERVICAL SPINE WO CONTRAST  Result Date: 06/29/2020 CLINICAL DATA:  Head trauma, fell from roof, negative past medical history EXAM: CT HEAD WITHOUT CONTRAST CT CERVICAL SPINE WITHOUT CONTRAST TECHNIQUE: Multidetector CT imaging of the head and cervical spine was performed following the standard protocol without intravenous contrast. Multiplanar CT image reconstructions of the cervical spine were also generated. COMPARISON:  None FINDINGS: CT HEAD FINDINGS Brain: Normal ventricular morphology. No midline shift or mass effect. Normal appearance of brain parenchyma. No intracranial hemorrhage, mass lesion, evidence of acute infarction, or extra-axial fluid collection. Vascular: No hyperdense vessels Skull: Intact Sinuses/Orbits: Scattered mucosal thickening in ethmoid air cells. Remaining paranasal sinuses and mastoid air cells clear Other: N/A CT CERVICAL SPINE FINDINGS Alignment: Retrolisthesis at C5-C6, approximately 3 mm. Remaining alignments normal. Skull base and vertebrae: Disc space narrowing C5-C6 and C4-C5, with small associated endplate spurs. Vertebral body and remaining disc space heights maintained. Skull base intact. No fracture, additional subluxation, or bone destruction. Soft tissues and spinal canal: Prevertebral soft tissues normal thickness. 5 mm LEFT thyroid nodule; not clinically significant; no follow-up imaging recommended (ref: J Am Coll Radiol. 2015 Feb;12(2): 143-50).Remaining soft tissues unremarkable Disc levels:  No specific abnormalities Upper chest: Lung apices clear Other: N/A IMPRESSION: No acute intracranial abnormalities. Degenerative disc disease changes at C5-C6 and C4-C5 with mild retrolisthesis at C5-C6. No acute cervical spine abnormalities. Electronically Signed   By: Ulyses Southward M.D.   On: 06/29/2020 17:20    CT ABDOMEN PELVIS W CONTRAST  Result Date: 06/29/2020 CLINICAL DATA:  Larey Seat from a roof. EXAM: CT CHEST, ABDOMEN, AND PELVIS WITH CONTRAST TECHNIQUE: Multidetector CT imaging of the chest, abdomen and pelvis was performed following the standard protocol during bolus administration of intravenous contrast. CONTRAST:  OMNIPAQUE IOHEXOL 300 MG/ML  SOLN COMPARISON:  None. FINDINGS: CT CHEST FINDINGS Cardiovascular: The heart is normal in size. No pericardial effusion. The aorta is normal in caliber. No dissection. The branch vessels are patent. Few scattered coronary artery calcifications are noted. The pulmonary arteries appear normal. Mediastinum/Nodes: No mediastinal or hilar mass or adenopathy or hematoma. The esophagus is grossly normal. Lungs/Pleura: Streaky dependent bibasilar subpleural atelectasis but no pulmonary contusion, pneumothorax or pleural effusion. Musculoskeletal: The bony thorax is intact. No sternal, rib or thoracic vertebral body fractures. CT ABDOMEN PELVIS FINDINGS Hepatobiliary: No acute hepatic injury or perihepatic fluid collections. The bladder is surgically absent. No intra or extrahepatic biliary dilatation. Pancreas: No mass, inflammation or ductal dilatation. No acute pancreatic injury  or peripancreatic fluid collection. Incidental pancreatic divisum. Spleen: Normal size. No acute splenic injury or perisplenic fluid collection. Adrenals/Urinary Tract: The adrenal glands and kidneys are unremarkable. Small renal calculi noted. No acute renal injury or perirenal fluid collection. The bladder is unremarkable. Stomach/Bowel: The stomach, duodenum, small bowel and colon are unremarkable. No evidence of acute bowel injury. No free fluid or free air. Vascular/Lymphatic: The aorta and branch vessels are patent. The major venous structures are patent. No mesenteric or retroperitoneal mass, adenopathy or hematoma. Reproductive: The prostate gland and seminal vesicles are unremarkable.  Other: There is a mixed fatty lesion projecting off the left buttock region. This may be some type of hernia or exophytic fatty lesion. It should be obvious clinically. Few surgical clips are noted in the perirectal space of uncertain etiology. Musculoskeletal: There is a displaced fracture of the right femoral neck. The pubic symphysis and SI joints are intact. No pelvic fractures. The lumbar vertebral bodies are normally aligned. No lumbar spine fractures. IMPRESSION: 1. Displaced right femoral neck fracture. 2. No other significant findings in the chest, abdomen or pelvis. 3. Mixed fatty lesion projecting off the left buttock region. This may be some type of hernia or exophytic fatty lesion. It should be obvious clinically. 4. Status post cholecystectomy. No biliary dilatation. 5. Small renal calculi. Electronically Signed   By: Rudie Meyer M.D.   On: 06/29/2020 17:22   DG Pelvis Portable  Result Date: 06/30/2020 CLINICAL DATA:  Status post total replacement of right hip EXAM: PORTABLE PELVIS 1-2 VIEWS COMPARISON:  None. FINDINGS: RIGHT total hip arthroplasty. Skin staples noted. Expected postsurgical change in the soft tissues. IMPRESSION: No complication following RIGHT hip arthroplasty Electronically Signed   By: Genevive Bi M.D.   On: 06/30/2020 11:18   DG Pelvis Portable  Result Date: 06/29/2020 CLINICAL DATA:  80 foot fall, right hip pain EXAM: PORTABLE PELVIS 1-2 VIEWS COMPARISON:  Contemporary view of the femur FINDINGS: There is a valgus angulated, mildly foreshortened transcervical right femoral neck fracture. Femoral heads remain normally located. No proximal left femoral fracture. Remaining bones of the pelvis appear intact and congruent without disruption of the sacral arcuate lines or diastasis of the symphysis pubis or SI joints. Surgical clips noted in the deep pelvis. Mild soft tissue swelling of the right hip and femur. IMPRESSION: 1. Valgus angulated, mildly foreshortened  transcervical right femoral fracture. 2. No other acute osseous abnormality. Electronically Signed   By: Kreg Shropshire M.D.   On: 06/29/2020 16:56   DG Chest Portable 1 View  Result Date: 06/29/2020 CLINICAL DATA:  18 foot fall EXAM: PORTABLE CHEST 1 VIEW COMPARISON:  None FINDINGS: No visible acute traumatic osseous injuries within the chest. Some mild atelectatic changes in the otherwise clear lungs. Normal cardiomediastinal contours for the portable technique. No pneumothorax. No effusion. Telemetry leads overlie the chest. IMPRESSION: No acute cardiopulmonary or traumatic findings in the chest. Electronically Signed   By: Kreg Shropshire M.D.   On: 06/29/2020 16:52   DG C-Arm 1-60 Min  Result Date: 06/30/2020 CLINICAL DATA:  Status post total hip replacement EXAM: OPERATIVE RIGHT HIP  2 VIEWS TECHNIQUE: Fluoroscopic spot image(s) were submitted for interpretation post-operatively. COMPARISON:  June 29, 2020 FLUOROSCOPY TIME:  0 minutes 46 seconds; 4.10 mGy; 11 acquired images FINDINGS: Frontal and lateral views obtained. Series of images show placement of total hip replacement prostheses on the right. Final images show prosthetic components well-seated. No fracture or dislocation following prostheses placement. IMPRESSION: Total hip replacement  on the right with prosthetic components well-seated. No fracture or dislocation after hip replacement prostheses placed. Electronically Signed   By: Bretta Bang III M.D.   On: 06/30/2020 10:59   DG HIP OPERATIVE UNILAT WITH PELVIS RIGHT  Result Date: 06/30/2020 CLINICAL DATA:  Status post total hip replacement EXAM: OPERATIVE RIGHT HIP  2 VIEWS TECHNIQUE: Fluoroscopic spot image(s) were submitted for interpretation post-operatively. COMPARISON:  June 29, 2020 FLUOROSCOPY TIME:  0 minutes 46 seconds; 4.10 mGy; 11 acquired images FINDINGS: Frontal and lateral views obtained. Series of images show placement of total hip replacement prostheses on the  right. Final images show prosthetic components well-seated. No fracture or dislocation following prostheses placement. IMPRESSION: Total hip replacement on the right with prosthetic components well-seated. No fracture or dislocation after hip replacement prostheses placed. Electronically Signed   By: Bretta Bang III M.D.   On: 06/30/2020 10:59   DG Hip Unilat W or Wo Pelvis 2-3 Views Right  Result Date: 06/29/2020 CLINICAL DATA:  RIGHT hip pain, fall from roof EXAM: DG HIP (WITH OR WITHOUT PELVIS) 2-3V RIGHT COMPARISON:  CT study of 06/29/2020 FINDINGS: Displaced RIGHT femoral neck fracture as on the previous CT. Femoral head remains located. No sign of pelvic fracture. Excreted contrast in the urinary bladder. IMPRESSION: Displaced RIGHT femoral neck fracture as on the recent CT. Electronically Signed   By: Donzetta Kohut M.D.   On: 06/29/2020 17:59   DG Femur Portable 1 View Right  Result Date: 06/29/2020 CLINICAL DATA:  18 foot fall EXAM: RIGHT FEMUR PORTABLE 1 VIEW COMPARISON:  Contemporary pelvic radiograph. FINDINGS: Single AP portable scout view of the right femur encompasses the femoral diaphysis and only partially includes the proximal femur and distal femoral metaphysis. No acute fracture is evident. Question some mild soft tissue swelling of the thigh predominantly laterally. Correlate for contusive change. IMPRESSION: 1. No acute osseous abnormality. 2. Soft tissue swelling of the thigh seen laterally. Correlate for contusive change. 3. Single portable view excluding portion of the distal femur. Proximal femur better seen on pelvic radiograph. Consider dedicated completion radiography when patient is able to tolerate. Electronically Signed   By: Kreg Shropshire M.D.   On: 06/29/2020 16:54    Disposition: Discharge disposition: 01-Home or Self Care          Follow-up Information    Kathryne Hitch, MD. Schedule an appointment as soon as possible for a visit in 2 week(s).    Specialty: Orthopedic Surgery Contact information: [address] [place] [address] [phone]        [facility] Follow up.   Why: [facility]  Contact information: [address] [place] [address] [phone]                Signed: Kathryne Hitch 07/02/2020, 7:41 AM

## 2020-07-02 NOTE — Progress Notes (Signed)
Orthopedic Tech Progress Note Patient Details:  Justin Lam [birth year] [id number]  Ortho Devices Type of Ortho Device: Crutches Ortho Device/Splint Interventions: Adjustment   Post Interventions Patient Tolerated: Ambulated well Instructions Provided: Poper ambulation with device, Care of device   Donald Pore 07/02/2020, 12:37 PM

## 2020-07-03 ENCOUNTER — Encounter (HOSPITAL_COMMUNITY): Payer: Self-pay

## 2020-07-03 ENCOUNTER — Telehealth: Payer: Self-pay

## 2020-07-03 NOTE — Telephone Encounter (Signed)
Verbal order given  

## 2020-07-03 NOTE — Telephone Encounter (Signed)
Trey Paula with [facility] would like verbal orders for HHPT for 1 x week for 3 weeks and consult for social work?  Cb# [phone].  Please advise.  Thank you.

## 2020-07-10 ENCOUNTER — Telehealth: Payer: Self-pay

## 2020-07-10 NOTE — Telephone Encounter (Signed)
[facility] calling to inform that they are seeing patient 1x a week

## 2020-07-15 ENCOUNTER — Encounter: Payer: Self-pay | Admitting: Physician Assistant

## 2020-07-15 ENCOUNTER — Ambulatory Visit (INDEPENDENT_AMBULATORY_CARE_PROVIDER_SITE_OTHER): Payer: Self-pay | Admitting: Physician Assistant

## 2020-07-15 DIAGNOSIS — Z96641 Presence of right artificial hip joint: Secondary | ICD-10-CM

## 2020-07-15 MED ORDER — OXYCODONE HCL 5 MG PO TABS: 5.0000 mg | ORAL_TABLET | ORAL | 0 refills | Status: DC | PRN

## 2020-07-15 NOTE — Progress Notes (Signed)
HPI: Justin Lam is a 49 year old male who is 2 weeks status post right total hip arthroplasty secondary to a displaced femoral neck fracture due to high-energy trauma.  He is ambulating with crutches.  No fevers chills.  He is taking oxycodone for pain also using Robaxin for muscle spasm.  He has been on aspirin for DVT prophylaxis.  Physical exam: General well-developed well-nourished male in no acute distress. Right lower extremity surgical incisions well approximated staples no signs of infection.  Staples removed Steri-Strips applied.  Right hip pain with any attempts of internal rotation.  Right calf supple nontender.  Dorsiflexion plantarflexion right ankle intact.   Impression: Status post right total hip arthroplasty 06/30/2020 Right hip displaced femoral neck fracture  Plan: He will take 81 mg aspirin once daily for another week and then discontinue as he was on the last prior to surgery.  Scar tissue mobilization encouraged.  We will see him back in 1 month as he is making.  Did discuss with him that it will take him longer to get over his hip replacement due to the fact that he had a femoral neck fracture due to high-energy trauma.  Questions were encouraged and answered.

## 2020-07-17 ENCOUNTER — Other Ambulatory Visit: Payer: Self-pay | Admitting: Physician Assistant

## 2020-07-17 ENCOUNTER — Telehealth: Payer: Self-pay | Admitting: Orthopaedic Surgery

## 2020-07-17 MED ORDER — OXYCODONE HCL 5 MG PO TABS: 5.0000 mg | ORAL_TABLET | ORAL | 0 refills | Status: DC | PRN

## 2020-07-17 NOTE — Telephone Encounter (Signed)
Can you check on this?

## 2020-07-17 NOTE — Telephone Encounter (Signed)
This was not picked up at [facility], I cancelled it He wants it sent to [facility] because its cheaper

## 2020-07-17 NOTE — Telephone Encounter (Signed)
Patient called. Says he would like his medication RX sent to  [facility] - [place] ([place]), [place] - [address] Phone:  [phone]  Fax:  [phone]

## 2020-07-17 NOTE — Telephone Encounter (Signed)
Sent in

## 2020-08-06 ENCOUNTER — Telehealth: Payer: Self-pay | Admitting: Orthopaedic Surgery

## 2020-08-06 MED ORDER — OXYCODONE HCL 5 MG PO TABS: 5.0000 mg | ORAL_TABLET | Freq: Four times a day (QID) | ORAL | 0 refills | Status: AC | PRN

## 2020-08-06 NOTE — Telephone Encounter (Signed)
IC advised done 

## 2020-08-06 NOTE — Telephone Encounter (Signed)
Please advise. Thanks.  

## 2020-08-06 NOTE — Telephone Encounter (Signed)
I did send in the oxycodone for this patient.

## 2020-08-06 NOTE — Telephone Encounter (Signed)
Pt called stating he needs a refill of his oxycodone rx and a CB when it's been sent in  [phone]

## 2020-08-12 ENCOUNTER — Ambulatory Visit: Payer: Self-pay | Admitting: Physician Assistant

## 2020-08-19 ENCOUNTER — Ambulatory Visit: Payer: Self-pay | Admitting: Physician Assistant

## 2020-08-21 ENCOUNTER — Ambulatory Visit: Payer: Self-pay | Admitting: Physician Assistant

## 2022-05-14 ENCOUNTER — Encounter (HOSPITAL_COMMUNITY): Payer: Self-pay

## 2022-05-14 ENCOUNTER — Ambulatory Visit (HOSPITAL_COMMUNITY)
Admission: EM | Admit: 2022-05-14 | Discharge: 2022-05-14 | Disposition: A | Discharge status: Home / Self Care | Payer: Self-pay | Attending: Family Medicine | Admitting: Family Medicine

## 2022-05-14 DIAGNOSIS — H9201 Otalgia, right ear: Secondary | ICD-10-CM

## 2022-05-14 DIAGNOSIS — H6123 Impacted cerumen, bilateral: Secondary | ICD-10-CM

## 2022-05-14 MED ORDER — CARBAMIDE PEROXIDE 6.5 % OT SOLN: 5.0000 [drp] | Freq: Two times a day (BID) | OTIC | 0 refills | Status: AC | PRN

## 2022-05-14 MED ORDER — AMOXICILLIN 875 MG PO TABS: 875.0000 mg | ORAL_TABLET | Freq: Two times a day (BID) | ORAL | 0 refills | Status: AC

## 2022-05-14 MED ORDER — CARBAMIDE PEROXIDE 6.5 % OT SOLN
5.0000 [drp] | Freq: Once | OTIC | Status: AC
  Administered 2022-05-14: 5 [drp] via OTIC

## 2022-05-14 MED ORDER — CARBAMIDE PEROXIDE 6.5 % OT SOLN
OTIC | Status: AC
  Filled 2022-05-14: qty 15

## 2022-05-14 NOTE — ED Triage Notes (Signed)
Right ear pain for the last 2 months. Onset after water got in his ear during a shower. States have not been able to get anything in our out of the ear.  Patient states he did use a q-tip in the ear and that made it worse.

## 2022-05-14 NOTE — ED Provider Notes (Signed)
[facility]    CSN: [id number] Arrival date & time: 05/14/22  1544      History   Chief Complaint Chief Complaint  Patient presents with   Otalgia    HPI Justin Lam is a 51 y.o. male.   HPI Patient presents for evaluation of cerumen impaction involving both ears, right ear pain. He attempted to remove ear wax with qtip without relief.  He also recalls laying back in a tub of water 2 months ago and getting  He reports 2 months ago, getting water in his right ear and being unable to remove the water. He has gradually developed pain and swelling of the inner right ear. He also endorses diminished hearing. Past Medical History:  Diagnosis Date   Hepatitis    history of hep c per patient   Hepatitis C     Patient Active Problem List   Diagnosis Date Noted   Closed displaced fracture of neck of right femur (HCC) 06/29/2020   Closed right hip fracture (HCC) 06/29/2020   Foreign body of left knee 07/20/2019    Past Surgical History:  Procedure Laterality Date   CHOLECYSTECTOMY     FOREIGN BODY REMOVAL  07/20/2019   Procedure: Removal Foreign Body Left Lower Extremity;  Surgeon: Myrene Galas, MD;  Location: [facility] OR;  Service: Orthopedics;;   KNEE ARTHROSCOPY Left 07/20/2019   Procedure: ARTHROSCOPY Irrigation and Debridment of Left knee;  Surgeon: Myrene Galas, MD;  Location: [facility] OR;  Service: Orthopedics;  Laterality: Left;   KNEE SURGERY Right    NOSE SURGERY     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 Authorizing Provider  amoxicillin (AMOXIL) 875 MG tablet Take 1 tablet (875 mg total) by mouth 2 (two) times daily. 05/14/22  Yes Bing Neighbors, FNP   carbamide peroxide (DEBROX) 6.5 % OTIC solution Place 5 drops into the right ear 2 (two) times daily as needed. 05/14/22  Yes Bing Neighbors, FNP  acetaminophen (TYLENOL) 500 MG tablet Take 1 tablet (500 mg total) by mouth every 12 (twelve) hours. 07/21/19   Montez Morita, PA-C  aspirin 81 MG chewable tablet Chew 1 tablet (81 mg total) by mouth 2 (two) times daily. 07/02/20   Kathryne Hitch, MD  docusate sodium (COLACE) 100 MG capsule Take 1 capsule (100 mg total) by mouth 2 (two) times daily. 07/21/19   Montez Morita, PA-C  methocarbamol (ROBAXIN) 500 MG tablet Take 1-2 tablets (500-1,000 mg total) by mouth every 8 (eight) hours as needed for muscle spasms. 07/21/19   Montez Morita, PA-C  methocarbamol (ROBAXIN) 500 MG tablet Take 1 tablet (500 mg total) by mouth every 6 (six) hours as needed for muscle spasms. 07/02/20   Kathryne Hitch, MD  oxyCODONE (OXY IR/ROXICODONE) 5 MG immediate release tablet Take 1-2 tablets (5-10 mg total) by mouth every 6 (six) hours as needed for moderate pain (pain score 4-6). 08/06/20   Kathryne Hitch, MD    Family History History reviewed. No pertinent family history.  Social History Social History   Tobacco Use   Smoking status: Every Day    Packs/day: 0.50    Types: Cigarettes   Smokeless tobacco:  Never  Vaping Use   Vaping Use: Never used  Substance Use Topics   Alcohol use: Not Currently   Drug use: Never     Allergies   Patient has no known allergies.  Review of Systems Review of Systems Pertinent negatives listed in HPI   Physical Exam Triage Vital Signs ED Triage Vitals  Enc Vitals Group     BP 05/14/22 1628 112/82     Pulse Rate 05/14/22 1628 66     Resp 05/14/22 1628 16     Temp 05/14/22 1628 97.7 F (36.5 C)     Temp Source 05/14/22 1628 Oral     SpO2 05/14/22 1628 100 %     Weight 05/14/22 1631 140 lb (63.5 kg)     Height 05/14/22 1631 5\' 8"  (1.727 m)     Head Circumference --      Peak Flow --       Pain Score 05/14/22 1630 8     Pain Loc --      Pain Edu? --      Excl. in GC? --    No data found.  Updated Vital Signs BP 112/82 (BP Location: Left Arm)   Pulse 66   Temp 97.7 F (36.5 C) (Oral)   Resp 16   Ht 5\' 8"  (1.727 m)   Wt 140 lb (63.5 kg)   SpO2 100%   BMI 21.29 kg/m   Visual Acuity Right Eye Distance:   Left Eye Distance:   Bilateral Distance:    Right Eye Near:   Left Eye Near:    Bilateral Near:     Physical Exam Constitutional:      Appearance: Normal appearance.  HENT:     Right Ear: Decreased hearing noted. Swelling and tenderness present. There is impacted cerumen.     Left Ear: Decreased hearing noted. There is impacted cerumen.  Cardiovascular:     Rate and Rhythm: Normal rate and regular rhythm.  Pulmonary:     Effort: Pulmonary effort is normal.     Breath sounds: Normal breath sounds and air entry.  Neurological:     General: No focal deficit present.     Mental Status: He is alert.     GCS: GCS eye subscore is 4. GCS verbal subscore is 5. GCS motor subscore is 6.  Psychiatric:        Attention and Perception: Attention and perception normal.        Mood and Affect: Mood normal.        Speech: Speech normal.        Behavior: Behavior normal. Behavior is cooperative.      UC Treatments / Results  Labs (all labs ordered are listed, but only abnormal results are displayed) Labs Reviewed - No data to display  EKG   Radiology No results found.  Procedures Procedures (including critical care time)  Medications Ordered in UC Medications  carbamide peroxide (DEBROX) 6.5 % OTIC (EAR) solution 5 drop (5 drops Both EARS Given 05/14/22 1724)    Initial Impression / Assessment and Plan / UC Course  I have reviewed the triage vital signs and the nursing notes.  Pertinent labs & imaging results that were available during my care of the patient were reviewed by me and considered in my medical decision making (see chart for details).     Ear irrigation performed by RN however patient was unable to tolerate earwax removal from the right ear.  Treating for otitis externa based  on overall swelling and tenderness of right ear.  Advised patient to trial Debrox drops 2 to 3 days and then return here to have earwax removed from right ear.  Ear irrigation was successful in left ear and patient endorses improvement of hearing and TM visible.  Patient verbalized understanding and agreement with plan. Final Clinical Impressions(s) / UC Diagnoses   Final diagnoses:  Bilateral impacted cerumen  Right ear pain     Discharge Instructions      Complete antibiotics. Once pain resolves, instill Debrox drops for 2-3 days, then return to have earwax removed from right ear.      ED Prescriptions     Medication Sig Dispense Auth. Provider   amoxicillin (AMOXIL) 875 MG tablet Take 1 tablet (875 mg total) by mouth 2 (two) times daily. 20 tablet Bing Neighbors, FNP   carbamide peroxide (DEBROX) 6.5 % OTIC solution Place 5 drops into the right ear 2 (two) times daily as needed. 15 mL Bing Neighbors, FNP      PDMP not reviewed this encounter.   Bing Neighbors, FNP 05/14/22 1754

## 2022-05-14 NOTE — Discharge Instructions (Addendum)
Complete antibiotics. Once pain resolves, instill Debrox drops for 2-3 days, then return to have earwax removed from right ear.

## 2022-07-15 IMAGING — CT CT CERVICAL SPINE W/O CM
3 of 5 series · 13 of 33 positions shown, 16 images · non-contrast
Comparison: None

CLINICAL DATA: Head trauma, fell from roof, negative past medical
history

EXAM:
CT HEAD WITHOUT CONTRAST
CT CERVICAL SPINE WITHOUT CONTRAST
TECHNIQUE: Multidetector CT imaging of the head and cervical spine was
performed following the standard protocol without intravenous
contrast. Multiplanar CT image reconstructions of the cervical spine
were also generated.

[Series 7: st thins · axial · 0.39mm/px · z∈[-250,-94]mm · 5 of 416 slices shown, 7 images]
[im 52/416  soft-tissue]
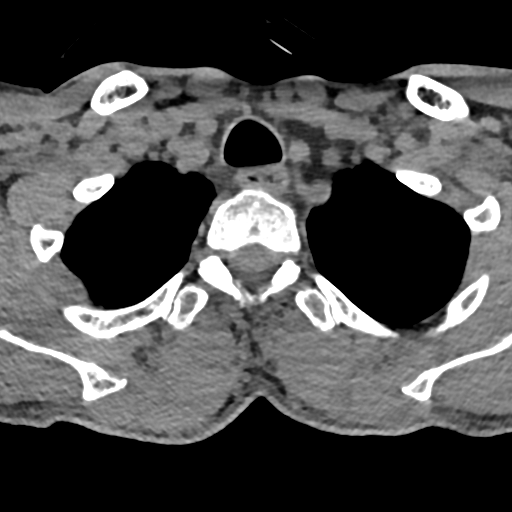
[im 52/416  bone]
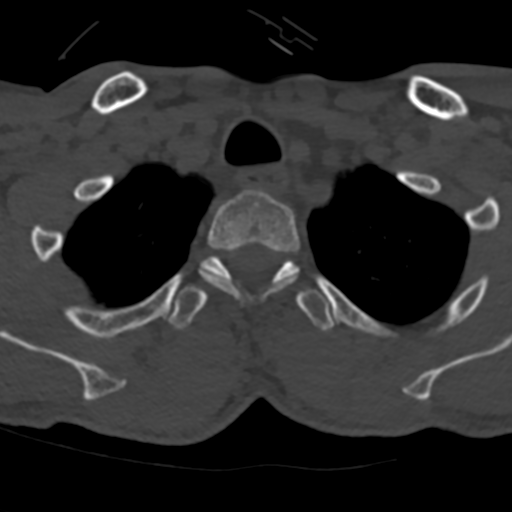
[im 156/416  bone]
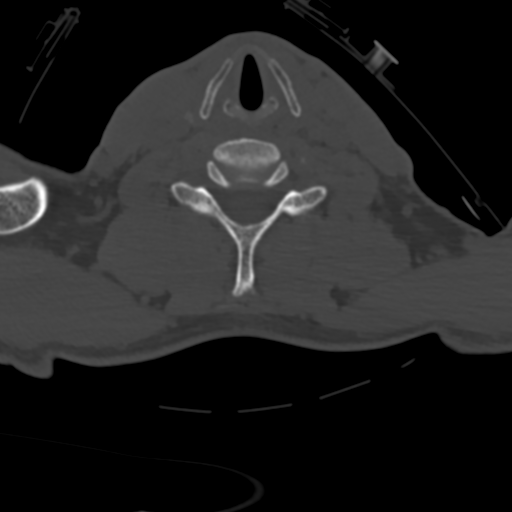
[im 208/416  bone]
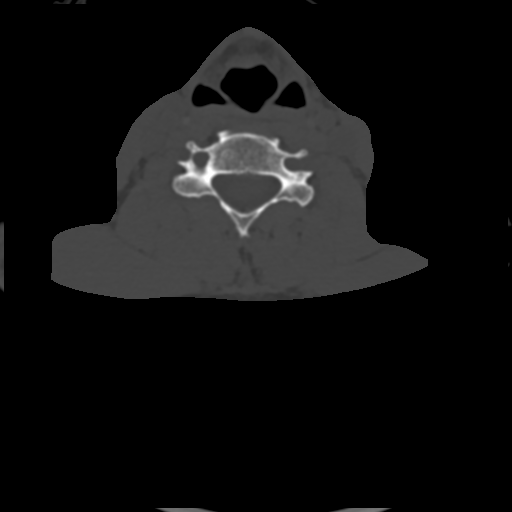
[im 260/416  bone]
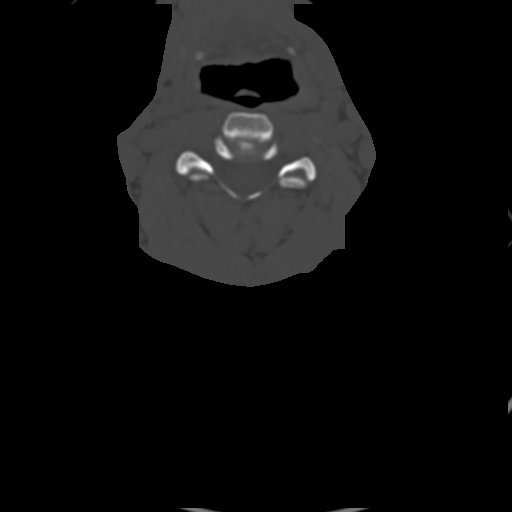
[im 364/416  soft-tissue]
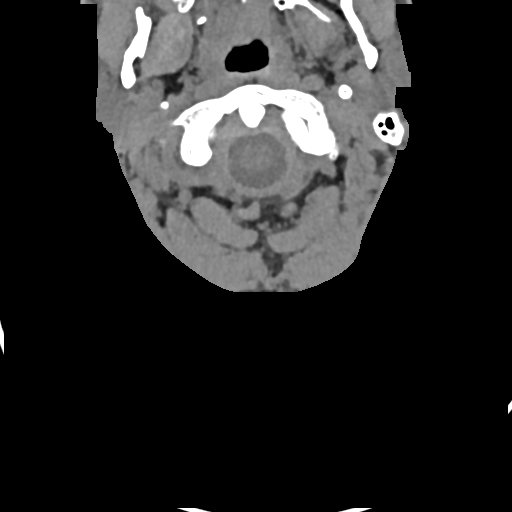
[im 364/416  bone]
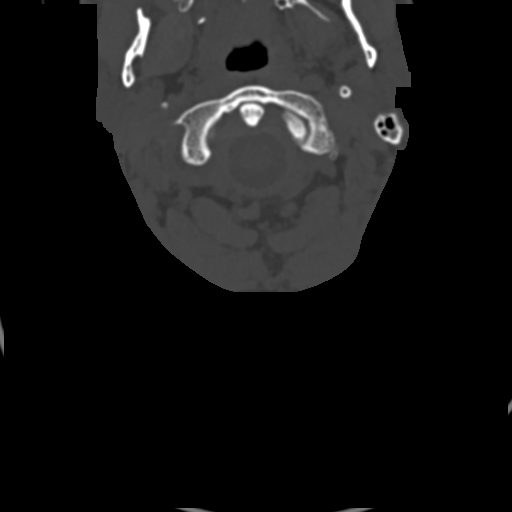

[Series 8: cor bone · coronal · 0.29mm/px · 3 of 90 slices shown]
[im 18/90  bone]
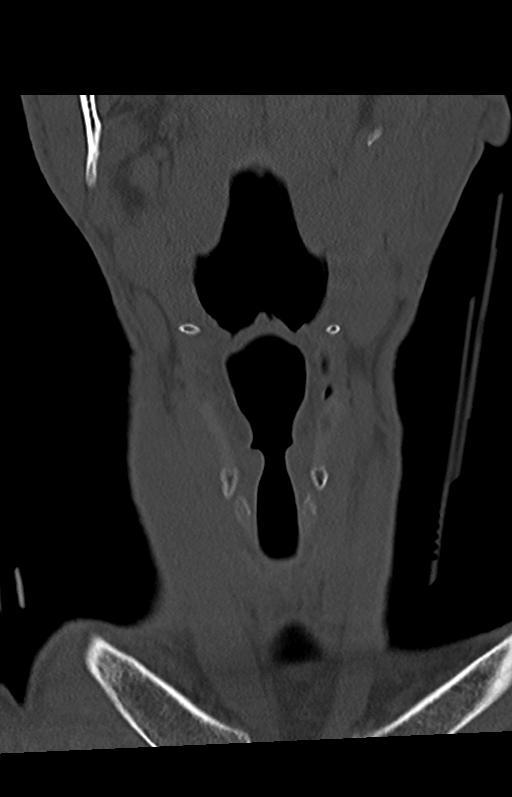
[im 36/90  bone]
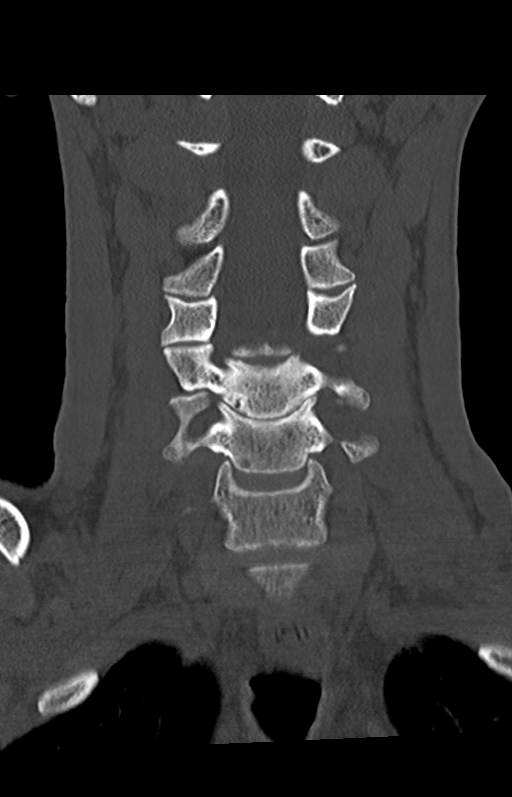
[im 54/90  bone]
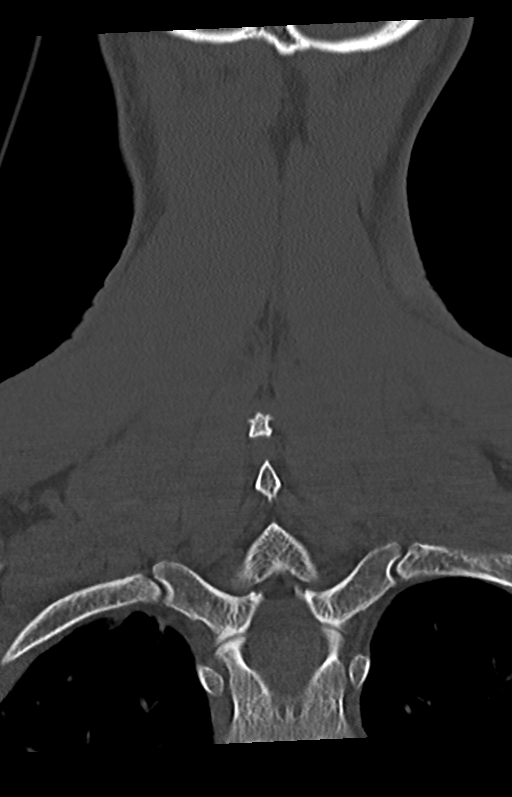

[Series 9: sag bone · sagittal · 0.34mm/px · 5 of 61 slices shown, 6 images]
[im 21/61  bone]
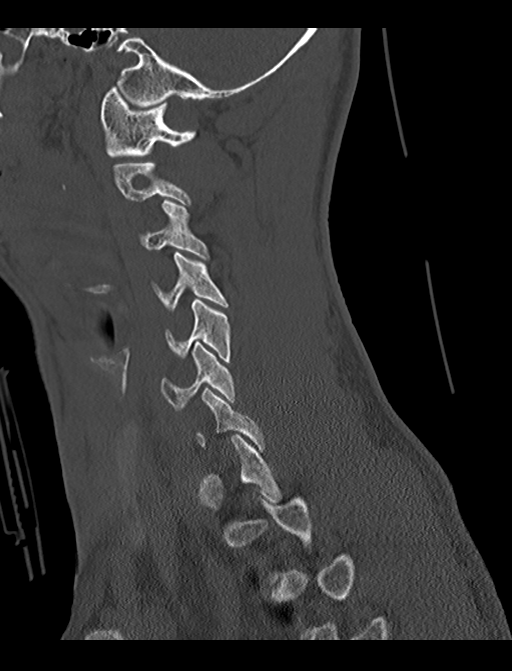
[im 26/61  bone]
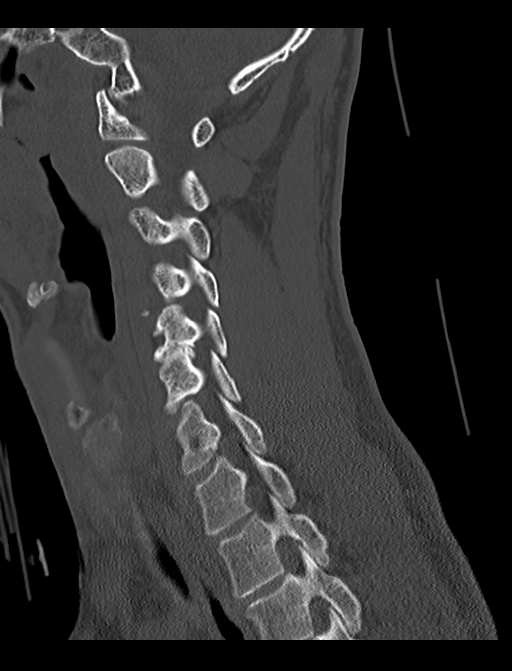
[im 31/61  soft-tissue]
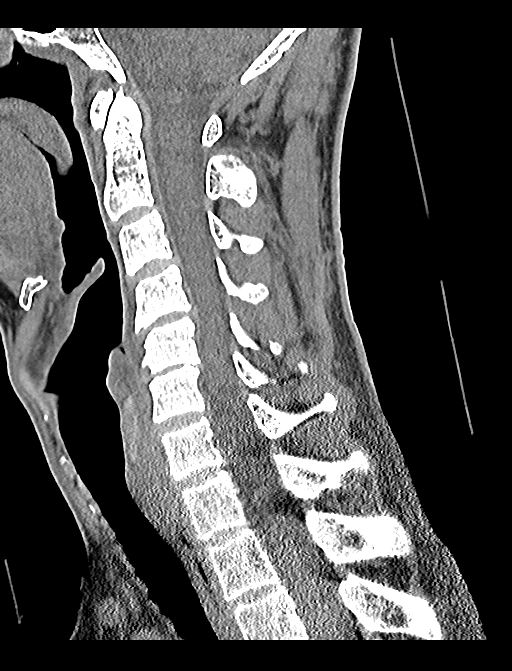
[im 31/61  bone]
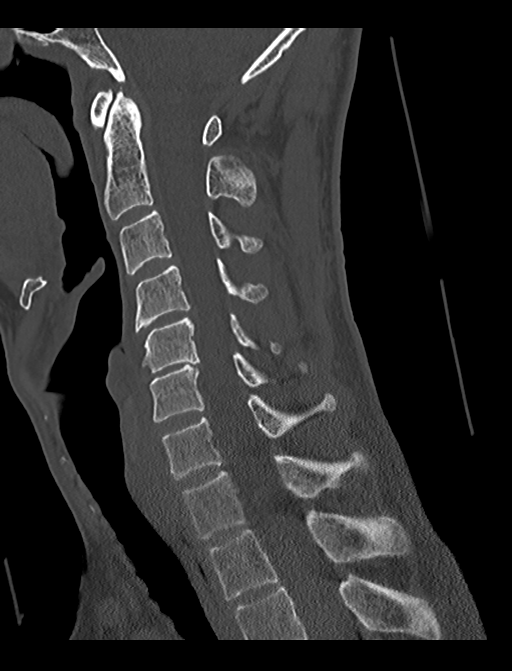
[im 36/61  bone]
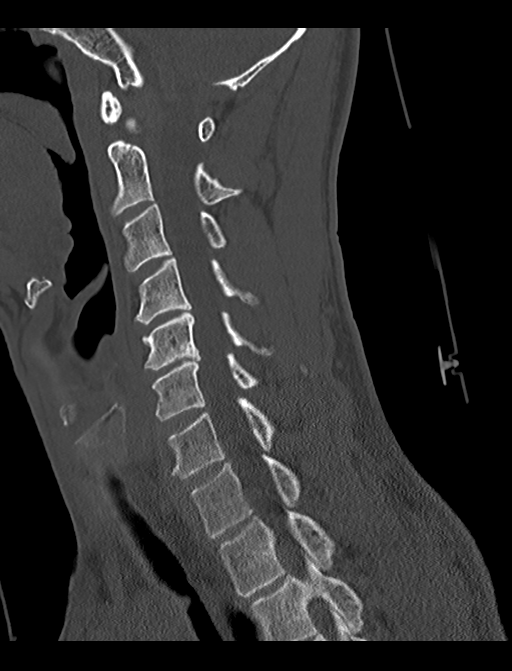
[im 41/61  bone]
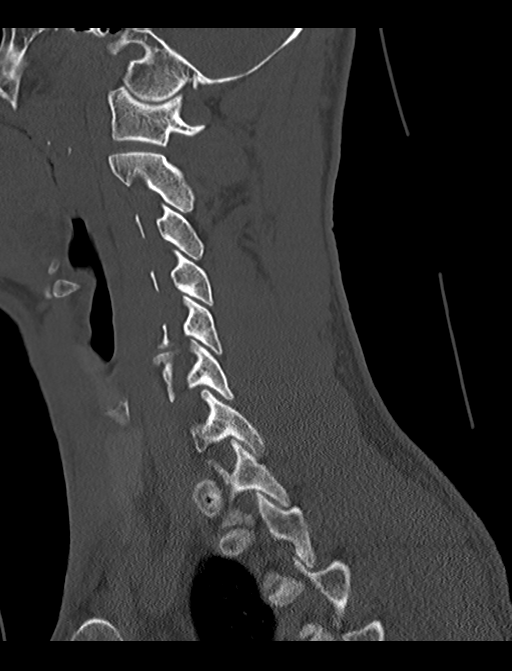

[13 of 33 positions shown; findings below may reference images not displayed]

FINDINGS: CT HEAD FINDINGS

Brain: Normal ventricular morphology. No midline shift or mass
effect. Normal appearance of brain parenchyma. No intracranial
hemorrhage, mass lesion, evidence of acute infarction, or
extra-axial fluid collection.

Vascular: No hyperdense vessels

Skull: Intact

Sinuses/Orbits: Scattered mucosal thickening in ethmoid air cells.
Remaining paranasal sinuses and mastoid air cells clear

Other: N/A

CT CERVICAL SPINE FINDINGS

Alignment: Retrolisthesis at C5-C6, approximately 3 mm. Remaining
alignments normal.

Skull base and vertebrae: Disc space narrowing C5-C6 and C4-C5, with
small associated endplate spurs. Vertebral body and remaining disc
space heights maintained. Skull base intact. No fracture, additional
subluxation, or bone destruction.

Soft tissues and spinal canal: Prevertebral soft tissues normal
thickness. 5 mm LEFT thyroid nodule; not clinically significant; no
follow-up imaging recommended (ref: [HOSPITAL]. [DATE]): 143-50).Remaining soft tissues unremarkable

Disc levels:  No specific abnormalities

Upper chest: Lung apices clear

Other: N/A
IMPRESSION: No acute intracranial abnormalities.

Degenerative disc disease changes at C5-C6 and C4-C5 with mild
retrolisthesis at C5-C6.

No acute cervical spine abnormalities.

## 2022-07-16 IMAGING — RF DG HIP (WITH PELVIS) OPERATIVE*R*
1 series · 15 of 15 positions shown · non-contrast
Comparison: June 29, 2020

FLUOROSCOPY TIME:  0 minutes 46 seconds; 4.10 mGy; 11 acquired
images

CLINICAL DATA: Status post total hip replacement

EXAM:
OPERATIVE RIGHT HIP  2 VIEWS
TECHNIQUE: Fluoroscopic spot image(s) were submitted for interpretation
post-operatively.

[Series 1: run · 15 of 15 slices shown]
[im 1/15]
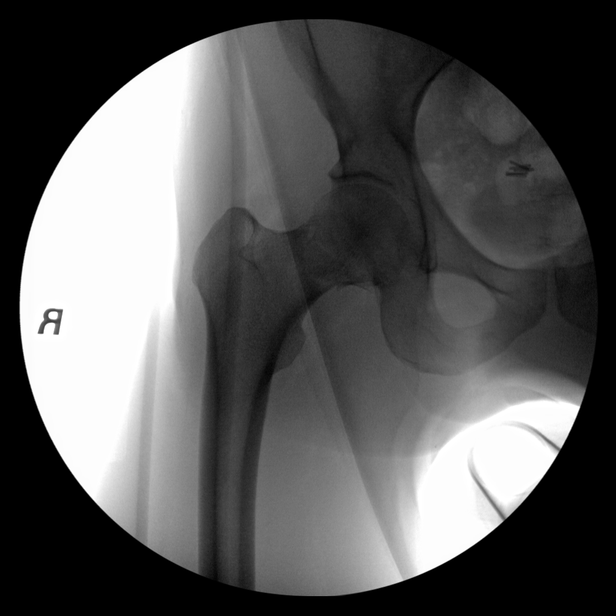
[im 2/15]
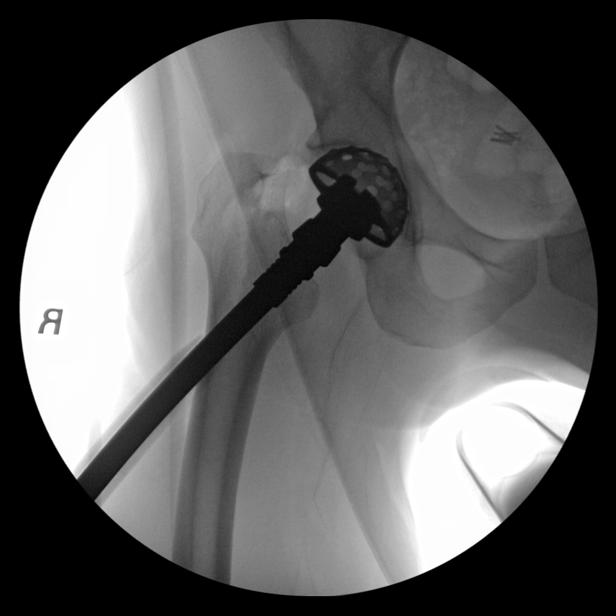
[im 3/15]
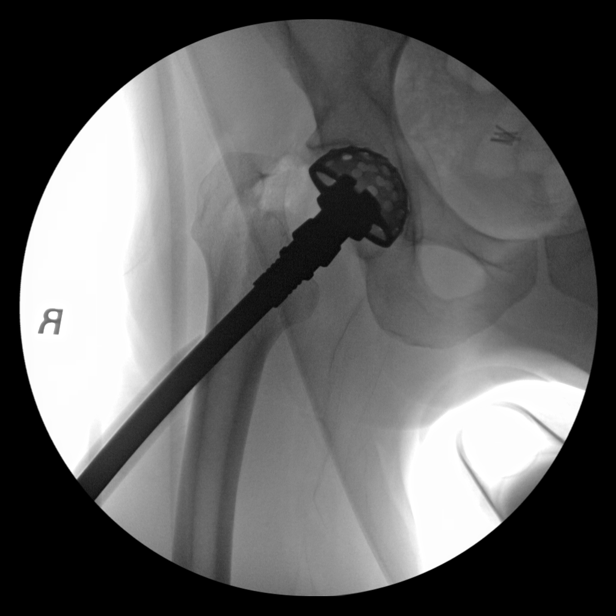
[im 4/15]
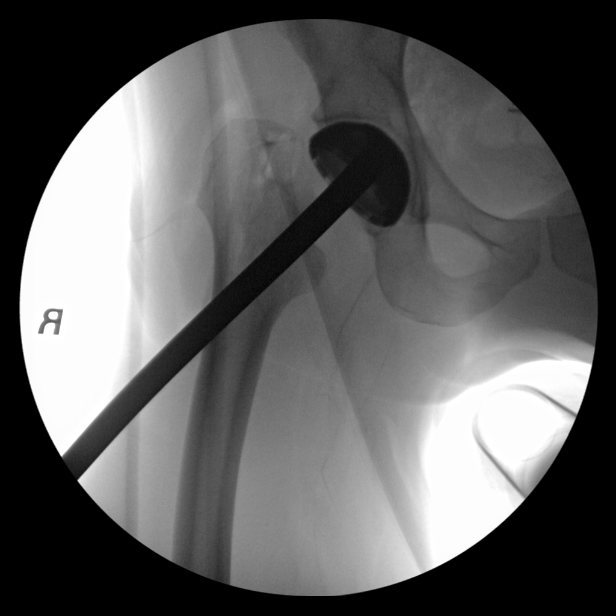
[im 5/15]
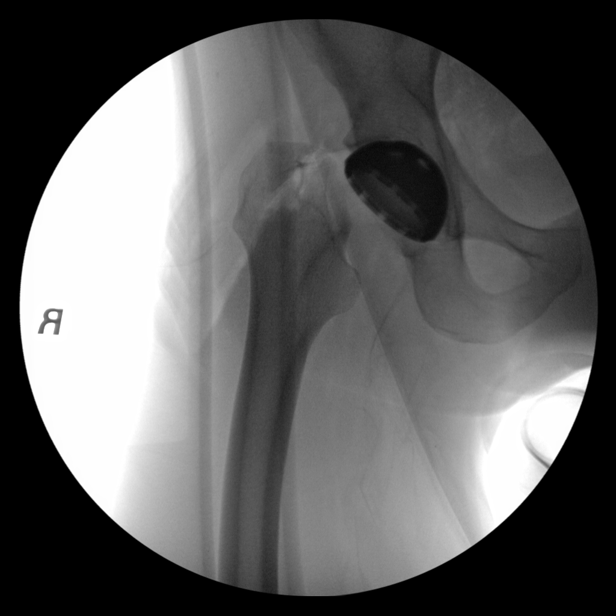
[im 6/15]
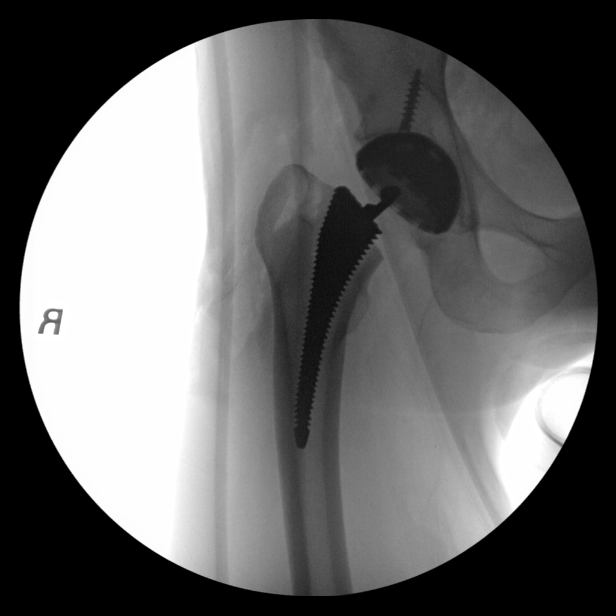
[im 7/15]
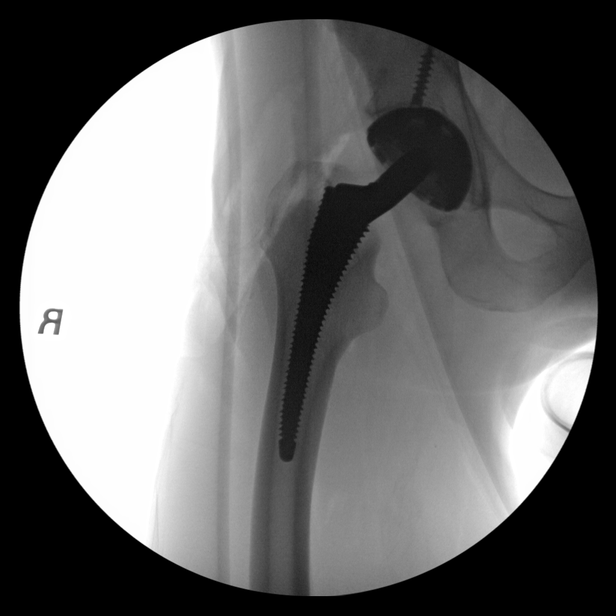
[im 8/15]
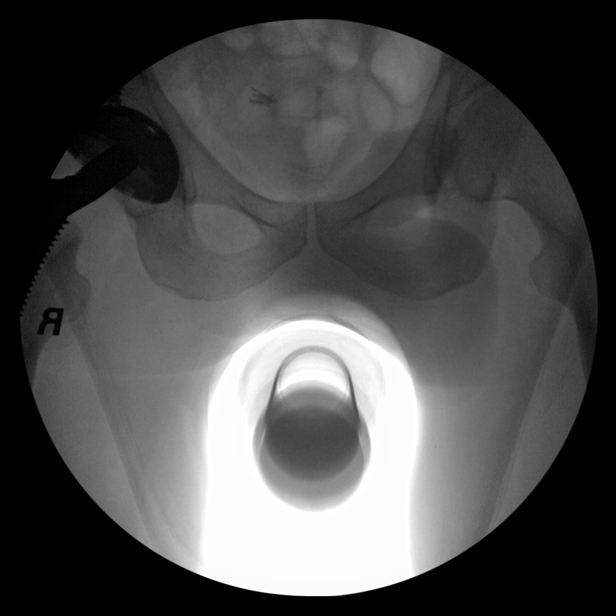
[im 9/15]
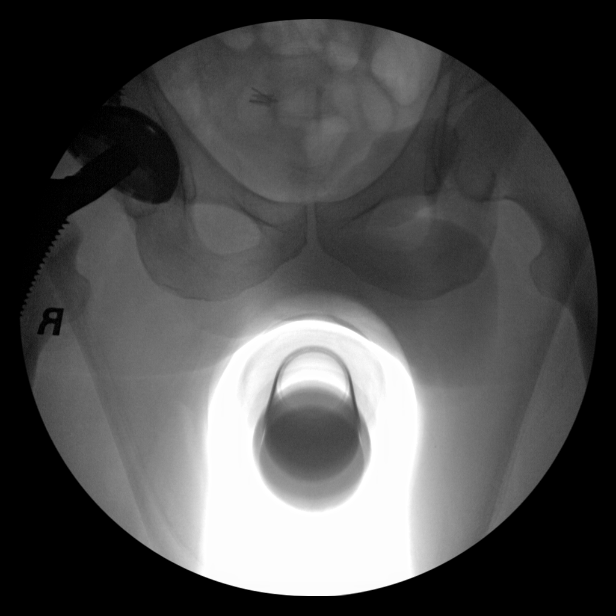
[im 10/15]
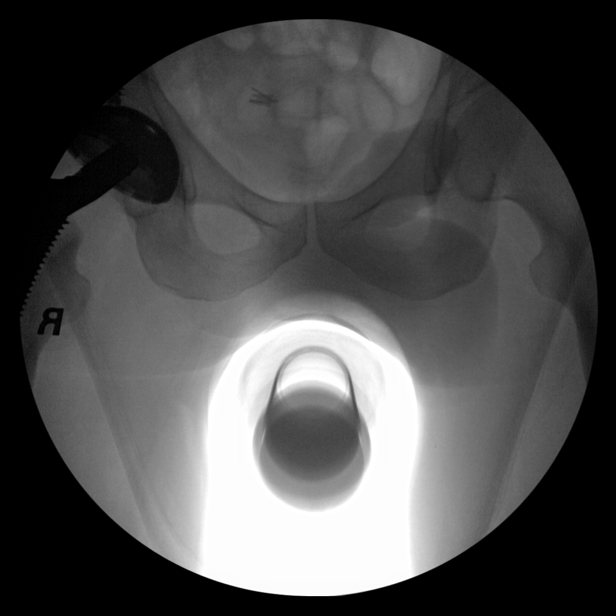
[im 11/15]
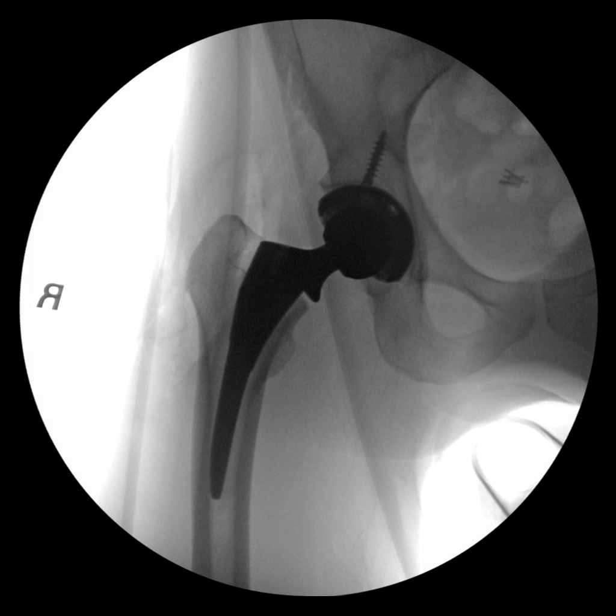
[im 12/15]
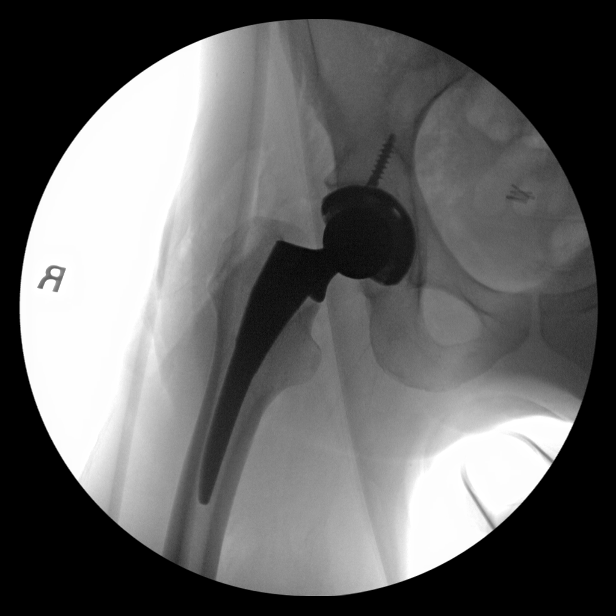
[im 13/15]
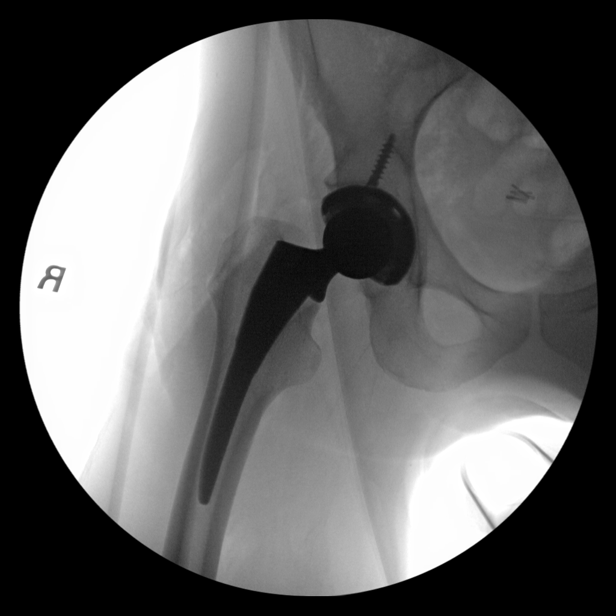
[im 14/15]
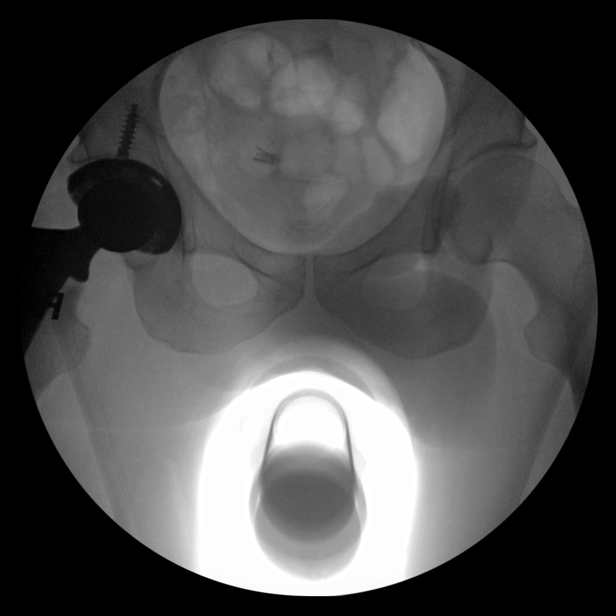
[im 15/15]
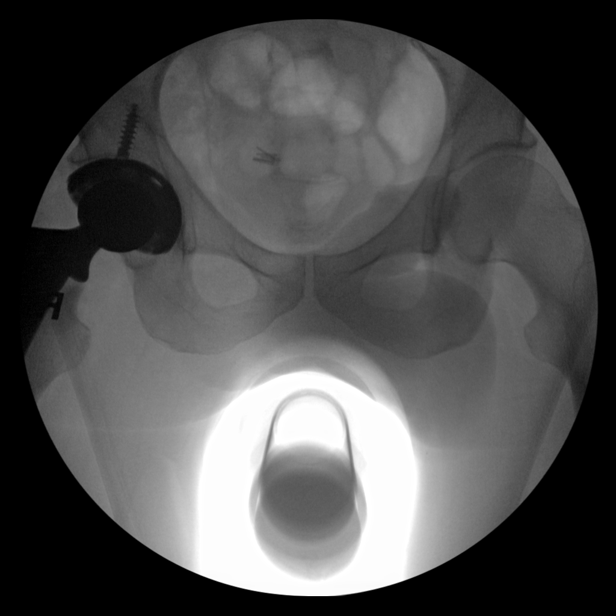

[15 of 15 positions shown; findings below may reference images not displayed]

FINDINGS: Frontal and lateral views obtained. Series of images show placement
of total hip replacement prostheses on the right. Final images show
prosthetic components well-seated. No fracture or dislocation
following prostheses placement.
IMPRESSION: Total hip replacement on the right with prosthetic components
well-seated. No fracture or dislocation after hip replacement
prostheses placed.

## 2022-08-26 ENCOUNTER — Ambulatory Visit (INDEPENDENT_AMBULATORY_CARE_PROVIDER_SITE_OTHER): Payer: Self-pay | Admitting: Orthopaedic Surgery

## 2022-08-26 ENCOUNTER — Encounter: Payer: Self-pay | Admitting: Orthopaedic Surgery

## 2022-08-26 ENCOUNTER — Ambulatory Visit (INDEPENDENT_AMBULATORY_CARE_PROVIDER_SITE_OTHER): Payer: Self-pay

## 2022-08-26 DIAGNOSIS — M25551 Pain in right hip: Secondary | ICD-10-CM

## 2022-08-26 MED ORDER — LIDOCAINE HCL 1 % IJ SOLN
3.0000 mL | INTRAMUSCULAR | Status: AC | PRN
  Administered 2022-08-26: 3 mL

## 2022-08-26 MED ORDER — METHYLPREDNISOLONE ACETATE 40 MG/ML IJ SUSP
40.0000 mg | INTRAMUSCULAR | Status: AC | PRN
  Administered 2022-08-26: 40 mg via INTRA_ARTICULAR

## 2022-08-26 MED ORDER — CELECOXIB 200 MG PO CAPS: 200.0000 mg | ORAL_CAPSULE | Freq: Every day | ORAL | 1 refills | Status: AC

## 2022-08-26 NOTE — Progress Notes (Signed)
Office Visit Note   Patient: Justin Lam           Date of Birth: 06/16/1971           MRN: [id number] Visit Date: 08/26/2022              Requested by: No referring provider defined for this encounter. PCP: Patient, No Pcp Per   Assessment & Plan: Visit Diagnoses:  1. Pain of right hip     Plan: He shown IT band stretching exercises.  He is given Celebrex to take instead of over-the-counter Aleve or ibuprofen.  See him back in 2 weeks to check his progress if he is doing well he can call and cancel the appointment.  Questions were encouraged and answered at length.  Radiographs were reviewed.  Follow-Up Instructions: Return in about 2 weeks (around 09/09/2022).   Orders:  Orders Placed This Encounter  Procedures   Large Joint Inj   XR HIP UNILAT W OR W/O PELVIS 1V RIGHT   Meds ordered this encounter  Medications   celecoxib (CELEBREX) 200 MG capsule    Sig: Take 1 capsule (200 mg total) by mouth daily.    Dispense:  30 capsule    Refill:  1      Procedures: Large Joint Inj on 08/26/2022 9:38 AM Indications: pain Details: 22 G 1.5 in needle, lateral approach  Arthrogram: No  Medications: 3 mL lidocaine 1 %; 40 mg methylPREDNISolone acetate 40 MG/ML Outcome: tolerated well, no immediate complications Procedure, treatment alternatives, risks and benefits explained, specific risks discussed. Consent was given by the patient. Immediately prior to procedure a time out was called to verify the correct patient, procedure, equipment, support staff and site/side marked as required. Patient was prepped and draped in the usual sterile fashion.      Clinical Data: No additional findings.   Subjective: Chief Complaint  Patient presents with   Right Hip - Pain    HPI Justin Lam is a 51 year old male comes in today with right hip pain.  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  Was doing well until Halloween night when he bent over and had a pop in his hip.   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  He has had no radicular symptoms down the leg.  Most of his pain is lateral aspect of the hip however he does have some low back pain at times.  Has had no real groin pain.  Review of Systems See HPI otherwise negative  Objective: Vital Signs: There were no vitals taken for this visit.  Physical Exam Constitutional:      Appearance: He is normal weight. He is not ill-appearing or diaphoretic.  Pulmonary:     Effort: Pulmonary effort is normal.  Neurological:     Mental Status: He is alert and oriented to person, place, and time.  Psychiatric:        Mood and Affect: Mood normal.     Ortho Exam Bilateral hips excellent range of motion without pain.  Nontender over the left hip trochanteric region.  Tenderness over the right hip trochanteric region.  Straight leg raise is negative bilaterally. Specialty Comments:  No specialty comments available.  Imaging: No results found.   PMFS History: Patient Active Problem List   Diagnosis Date Noted   Closed displaced fracture of neck of right femur (HCC) 06/29/2020   Closed right hip fracture (HCC) 06/29/2020   Foreign body  of left knee 07/20/2019   Past Medical History:  Diagnosis Date   Hepatitis    history of hep c per patient   Hepatitis C     No family history on file.  Past Surgical History:  Procedure Laterality Date   CHOLECYSTECTOMY     FOREIGN BODY REMOVAL  07/20/2019   Procedure: Removal Foreign Body Left Lower Extremity;  Surgeon: Myrene Galas, MD;  Location: [facility] OR;  Service: Orthopedics;;   KNEE ARTHROSCOPY Left 07/20/2019   Procedure: ARTHROSCOPY Irrigation and Debridment of Left knee;  Surgeon: Myrene Galas, MD;  Location: [facility] OR;  Service: Orthopedics;  Laterality: Left;   KNEE SURGERY Right    NOSE SURGERY     TOTAL HIP ARTHROPLASTY Right 06/30/2020   Procedure: TOTAL HIP ARTHROPLASTY ANTERIOR APPROACH;  Surgeon: Kathryne Hitch, MD;  Location: [facility] OR;  Service: Orthopedics;  Laterality: Right;   TOTAL KNEE ARTHROPLASTY Right 06/30/2020   Social History   Occupational History   Not on file  Tobacco Use   Smoking status: Every Day    Packs/day: 0.50    Types: Cigarettes   Smokeless tobacco: Never  Vaping Use   Vaping Use: Never used  Substance and Sexual Activity   Alcohol use: Not Currently   Drug use: Never   Sexual activity: Not on file
